# Patient Record
Sex: Female | Born: 1935 | Race: White | Hispanic: No | Marital: Married | Smoking: Former smoker
Health system: Southern US, Community
[De-identification: ages and names within clinical notes are randomized; demographics above are authoritative.]

## PROBLEM LIST (undated history)

## (undated) DIAGNOSIS — E7800 Pure hypercholesterolemia, unspecified: Secondary | ICD-10-CM

## (undated) DIAGNOSIS — I10 Essential (primary) hypertension: Secondary | ICD-10-CM

## (undated) HISTORY — DX: Pure hypercholesterolemia, unspecified: E78.00

## (undated) HISTORY — DX: Essential (primary) hypertension: I10

## (undated) HISTORY — PX: OTHER SURGICAL HISTORY: SHX169

---
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
## 1997-05-13 HISTORY — PX: CATARACT EXTRACTION, BILATERAL: SHX1313

## 1998-05-13 HISTORY — PX: OTHER SURGICAL HISTORY: SHX169

## 1999-04-03 ENCOUNTER — Ambulatory Visit (HOSPITAL_BASED_OUTPATIENT_CLINIC_OR_DEPARTMENT_OTHER): Admission: RE | Admit: 1999-04-03 | Discharge: 1999-04-03 | Payer: Self-pay | Admitting: Orthopedic Surgery

## 1999-05-14 HISTORY — PX: OTHER SURGICAL HISTORY: SHX169

## 1999-05-15 ENCOUNTER — Encounter: Payer: Self-pay | Admitting: Orthopedic Surgery

## 1999-05-15 ENCOUNTER — Encounter: Admission: RE | Admit: 1999-05-15 | Discharge: 1999-05-15 | Payer: Self-pay | Admitting: Orthopedic Surgery

## 1999-06-26 ENCOUNTER — Ambulatory Visit (HOSPITAL_BASED_OUTPATIENT_CLINIC_OR_DEPARTMENT_OTHER): Admission: RE | Admit: 1999-06-26 | Discharge: 1999-06-26 | Payer: Self-pay | Admitting: Orthopedic Surgery

## 2000-04-11 ENCOUNTER — Encounter: Payer: Self-pay | Admitting: Orthopedic Surgery

## 2000-04-12 HISTORY — PX: TOTAL HIP ARTHROPLASTY: SHX124

## 2000-04-14 ENCOUNTER — Encounter: Payer: Self-pay | Admitting: Orthopedic Surgery

## 2000-04-14 ENCOUNTER — Inpatient Hospital Stay (HOSPITAL_COMMUNITY): Admission: RE | Admit: 2000-04-14 | Discharge: 2000-04-18 | Payer: Self-pay | Admitting: Orthopedic Surgery

## 2001-05-13 HISTORY — PX: TOTAL HIP ARTHROPLASTY: SHX124

## 2002-02-11 ENCOUNTER — Encounter: Payer: Self-pay | Admitting: Orthopedic Surgery

## 2002-02-15 ENCOUNTER — Inpatient Hospital Stay (HOSPITAL_COMMUNITY): Admission: RE | Admit: 2002-02-15 | Discharge: 2002-02-19 | Payer: Self-pay | Admitting: Orthopedic Surgery

## 2002-02-16 ENCOUNTER — Encounter: Payer: Self-pay | Admitting: Orthopedic Surgery

## 2002-11-08 ENCOUNTER — Encounter: Admission: RE | Admit: 2002-11-08 | Discharge: 2002-11-08 | Payer: Self-pay | Admitting: Orthopedic Surgery

## 2002-11-08 ENCOUNTER — Encounter: Payer: Self-pay | Admitting: Orthopedic Surgery

## 2004-03-30 ENCOUNTER — Encounter: Payer: Self-pay | Admitting: Urology

## 2004-04-12 ENCOUNTER — Encounter: Payer: Self-pay | Admitting: Urology

## 2004-05-13 ENCOUNTER — Encounter: Payer: Self-pay | Admitting: Urology

## 2004-06-13 ENCOUNTER — Encounter: Payer: Self-pay | Admitting: Urology

## 2004-07-11 ENCOUNTER — Encounter: Payer: Self-pay | Admitting: Urology

## 2004-08-11 ENCOUNTER — Encounter: Payer: Self-pay | Admitting: Urology

## 2004-09-10 ENCOUNTER — Encounter: Payer: Self-pay | Admitting: Urology

## 2004-09-17 ENCOUNTER — Ambulatory Visit: Payer: Self-pay | Admitting: Unknown Physician Specialty

## 2004-12-04 ENCOUNTER — Ambulatory Visit: Payer: Self-pay | Admitting: Family Medicine

## 2006-01-07 ENCOUNTER — Ambulatory Visit: Payer: Self-pay | Admitting: Family Medicine

## 2006-05-13 HISTORY — PX: BASAL CELL CARCINOMA EXCISION: SHX1214

## 2007-01-23 ENCOUNTER — Ambulatory Visit: Payer: Self-pay | Admitting: Unknown Physician Specialty

## 2007-01-26 ENCOUNTER — Ambulatory Visit: Payer: Self-pay | Admitting: Unknown Physician Specialty

## 2007-01-30 ENCOUNTER — Encounter: Payer: Self-pay | Admitting: Unknown Physician Specialty

## 2007-02-06 ENCOUNTER — Ambulatory Visit: Payer: Self-pay | Admitting: Family Medicine

## 2007-02-11 ENCOUNTER — Encounter: Payer: Self-pay | Admitting: Unknown Physician Specialty

## 2007-03-14 ENCOUNTER — Encounter: Payer: Self-pay | Admitting: Unknown Physician Specialty

## 2007-04-13 ENCOUNTER — Encounter: Payer: Self-pay | Admitting: Unknown Physician Specialty

## 2007-05-14 ENCOUNTER — Encounter: Payer: Self-pay | Admitting: Unknown Physician Specialty

## 2007-05-14 HISTORY — PX: CYSTECTOMY: SUR359

## 2007-06-14 ENCOUNTER — Encounter: Payer: Self-pay | Admitting: Unknown Physician Specialty

## 2007-07-12 ENCOUNTER — Encounter: Payer: Self-pay | Admitting: Unknown Physician Specialty

## 2008-02-08 ENCOUNTER — Ambulatory Visit: Payer: Self-pay | Admitting: Family Medicine

## 2008-03-28 ENCOUNTER — Ambulatory Visit: Payer: Self-pay | Admitting: Unknown Physician Specialty

## 2009-02-09 ENCOUNTER — Ambulatory Visit: Payer: Self-pay | Admitting: Family Medicine

## 2009-03-11 ENCOUNTER — Ambulatory Visit: Payer: Self-pay | Admitting: Neurosurgery

## 2009-11-03 ENCOUNTER — Ambulatory Visit: Payer: Self-pay | Admitting: Unknown Physician Specialty

## 2010-02-12 ENCOUNTER — Ambulatory Visit: Payer: Self-pay | Admitting: Family Medicine

## 2010-02-26 ENCOUNTER — Ambulatory Visit: Payer: Self-pay | Admitting: Unknown Physician Specialty

## 2010-03-01 ENCOUNTER — Ambulatory Visit: Payer: Self-pay | Admitting: Unknown Physician Specialty

## 2010-11-26 ENCOUNTER — Emergency Department: Payer: Self-pay | Admitting: Emergency Medicine

## 2011-04-01 ENCOUNTER — Ambulatory Visit: Payer: Self-pay | Admitting: Family Medicine

## 2011-05-30 DIAGNOSIS — H40009 Preglaucoma, unspecified, unspecified eye: Secondary | ICD-10-CM | POA: Diagnosis not present

## 2011-06-06 DIAGNOSIS — J069 Acute upper respiratory infection, unspecified: Secondary | ICD-10-CM | POA: Diagnosis not present

## 2011-06-06 DIAGNOSIS — K449 Diaphragmatic hernia without obstruction or gangrene: Secondary | ICD-10-CM | POA: Diagnosis not present

## 2011-06-06 DIAGNOSIS — R05 Cough: Secondary | ICD-10-CM | POA: Diagnosis not present

## 2011-06-12 DIAGNOSIS — M65839 Other synovitis and tenosynovitis, unspecified forearm: Secondary | ICD-10-CM | POA: Diagnosis not present

## 2011-06-12 DIAGNOSIS — M65849 Other synovitis and tenosynovitis, unspecified hand: Secondary | ICD-10-CM | POA: Diagnosis not present

## 2011-06-12 DIAGNOSIS — M25569 Pain in unspecified knee: Secondary | ICD-10-CM | POA: Diagnosis not present

## 2011-07-01 DIAGNOSIS — R05 Cough: Secondary | ICD-10-CM | POA: Diagnosis not present

## 2011-07-17 DIAGNOSIS — R05 Cough: Secondary | ICD-10-CM | POA: Diagnosis not present

## 2011-07-22 DIAGNOSIS — M1990 Unspecified osteoarthritis, unspecified site: Secondary | ICD-10-CM | POA: Diagnosis not present

## 2011-07-22 DIAGNOSIS — K219 Gastro-esophageal reflux disease without esophagitis: Secondary | ICD-10-CM | POA: Diagnosis not present

## 2011-07-22 DIAGNOSIS — R030 Elevated blood-pressure reading, without diagnosis of hypertension: Secondary | ICD-10-CM | POA: Diagnosis not present

## 2011-07-24 DIAGNOSIS — M1990 Unspecified osteoarthritis, unspecified site: Secondary | ICD-10-CM | POA: Diagnosis not present

## 2011-07-24 DIAGNOSIS — M25569 Pain in unspecified knee: Secondary | ICD-10-CM | POA: Diagnosis not present

## 2011-08-05 DIAGNOSIS — M531 Cervicobrachial syndrome: Secondary | ICD-10-CM | POA: Diagnosis not present

## 2011-09-02 DIAGNOSIS — M79609 Pain in unspecified limb: Secondary | ICD-10-CM | POA: Diagnosis not present

## 2011-09-02 DIAGNOSIS — Z96649 Presence of unspecified artificial hip joint: Secondary | ICD-10-CM | POA: Diagnosis not present

## 2011-09-02 DIAGNOSIS — IMO0002 Reserved for concepts with insufficient information to code with codable children: Secondary | ICD-10-CM | POA: Diagnosis not present

## 2011-09-02 DIAGNOSIS — M25559 Pain in unspecified hip: Secondary | ICD-10-CM | POA: Diagnosis not present

## 2011-09-13 DIAGNOSIS — M47817 Spondylosis without myelopathy or radiculopathy, lumbosacral region: Secondary | ICD-10-CM | POA: Diagnosis not present

## 2011-09-13 DIAGNOSIS — IMO0002 Reserved for concepts with insufficient information to code with codable children: Secondary | ICD-10-CM | POA: Diagnosis not present

## 2011-09-13 DIAGNOSIS — M48062 Spinal stenosis, lumbar region with neurogenic claudication: Secondary | ICD-10-CM | POA: Diagnosis not present

## 2011-09-13 DIAGNOSIS — M5137 Other intervertebral disc degeneration, lumbosacral region: Secondary | ICD-10-CM | POA: Diagnosis not present

## 2011-10-04 DIAGNOSIS — M79609 Pain in unspecified limb: Secondary | ICD-10-CM | POA: Diagnosis not present

## 2011-10-04 DIAGNOSIS — M545 Low back pain: Secondary | ICD-10-CM | POA: Diagnosis not present

## 2011-10-04 DIAGNOSIS — IMO0002 Reserved for concepts with insufficient information to code with codable children: Secondary | ICD-10-CM | POA: Diagnosis not present

## 2012-02-12 DIAGNOSIS — I10 Essential (primary) hypertension: Secondary | ICD-10-CM | POA: Diagnosis not present

## 2012-03-09 DIAGNOSIS — I10 Essential (primary) hypertension: Secondary | ICD-10-CM | POA: Diagnosis not present

## 2012-03-10 DIAGNOSIS — Z23 Encounter for immunization: Secondary | ICD-10-CM | POA: Diagnosis not present

## 2012-04-08 ENCOUNTER — Ambulatory Visit: Payer: Self-pay | Admitting: Family Medicine

## 2012-04-08 DIAGNOSIS — Z1231 Encounter for screening mammogram for malignant neoplasm of breast: Secondary | ICD-10-CM | POA: Diagnosis not present

## 2012-04-16 ENCOUNTER — Ambulatory Visit: Payer: Self-pay | Admitting: Unknown Physician Specialty

## 2012-04-16 DIAGNOSIS — K449 Diaphragmatic hernia without obstruction or gangrene: Secondary | ICD-10-CM | POA: Diagnosis not present

## 2012-04-16 DIAGNOSIS — K219 Gastro-esophageal reflux disease without esophagitis: Secondary | ICD-10-CM | POA: Diagnosis not present

## 2012-04-16 DIAGNOSIS — R059 Cough, unspecified: Secondary | ICD-10-CM | POA: Diagnosis not present

## 2012-04-16 DIAGNOSIS — R05 Cough: Secondary | ICD-10-CM | POA: Diagnosis not present

## 2012-05-01 DIAGNOSIS — R6884 Jaw pain: Secondary | ICD-10-CM | POA: Diagnosis not present

## 2012-05-01 DIAGNOSIS — S0003XA Contusion of scalp, initial encounter: Secondary | ICD-10-CM | POA: Diagnosis not present

## 2012-05-01 DIAGNOSIS — S0083XA Contusion of other part of head, initial encounter: Secondary | ICD-10-CM | POA: Diagnosis not present

## 2012-06-08 DIAGNOSIS — I10 Essential (primary) hypertension: Secondary | ICD-10-CM | POA: Diagnosis not present

## 2012-06-10 DIAGNOSIS — M719 Bursopathy, unspecified: Secondary | ICD-10-CM | POA: Diagnosis not present

## 2012-07-20 DIAGNOSIS — I10 Essential (primary) hypertension: Secondary | ICD-10-CM | POA: Diagnosis not present

## 2012-07-20 DIAGNOSIS — E785 Hyperlipidemia, unspecified: Secondary | ICD-10-CM | POA: Diagnosis not present

## 2012-07-23 DIAGNOSIS — H353190 Nonexudative age-related macular degeneration, unspecified eye, stage unspecified: Secondary | ICD-10-CM | POA: Diagnosis not present

## 2012-08-03 DIAGNOSIS — R51 Headache: Secondary | ICD-10-CM | POA: Diagnosis not present

## 2012-08-04 DIAGNOSIS — E785 Hyperlipidemia, unspecified: Secondary | ICD-10-CM | POA: Diagnosis not present

## 2012-08-04 DIAGNOSIS — Z79899 Other long term (current) drug therapy: Secondary | ICD-10-CM | POA: Diagnosis not present

## 2012-08-05 DIAGNOSIS — D649 Anemia, unspecified: Secondary | ICD-10-CM | POA: Diagnosis not present

## 2012-08-11 DIAGNOSIS — M719 Bursopathy, unspecified: Secondary | ICD-10-CM | POA: Diagnosis not present

## 2012-08-11 DIAGNOSIS — M67919 Unspecified disorder of synovium and tendon, unspecified shoulder: Secondary | ICD-10-CM | POA: Diagnosis not present

## 2012-08-13 DIAGNOSIS — M6281 Muscle weakness (generalized): Secondary | ICD-10-CM | POA: Diagnosis not present

## 2012-08-13 DIAGNOSIS — M25519 Pain in unspecified shoulder: Secondary | ICD-10-CM | POA: Diagnosis not present

## 2012-08-14 DIAGNOSIS — D649 Anemia, unspecified: Secondary | ICD-10-CM | POA: Diagnosis not present

## 2012-08-17 DIAGNOSIS — M6281 Muscle weakness (generalized): Secondary | ICD-10-CM | POA: Diagnosis not present

## 2012-08-17 DIAGNOSIS — M25519 Pain in unspecified shoulder: Secondary | ICD-10-CM | POA: Diagnosis not present

## 2012-08-18 DIAGNOSIS — M25519 Pain in unspecified shoulder: Secondary | ICD-10-CM | POA: Diagnosis not present

## 2012-08-18 DIAGNOSIS — M6281 Muscle weakness (generalized): Secondary | ICD-10-CM | POA: Diagnosis not present

## 2012-08-21 DIAGNOSIS — M25519 Pain in unspecified shoulder: Secondary | ICD-10-CM | POA: Diagnosis not present

## 2012-08-21 DIAGNOSIS — M6281 Muscle weakness (generalized): Secondary | ICD-10-CM | POA: Diagnosis not present

## 2012-08-24 DIAGNOSIS — M25519 Pain in unspecified shoulder: Secondary | ICD-10-CM | POA: Diagnosis not present

## 2012-08-24 DIAGNOSIS — M6281 Muscle weakness (generalized): Secondary | ICD-10-CM | POA: Diagnosis not present

## 2012-08-26 DIAGNOSIS — M25519 Pain in unspecified shoulder: Secondary | ICD-10-CM | POA: Diagnosis not present

## 2012-08-26 DIAGNOSIS — M6281 Muscle weakness (generalized): Secondary | ICD-10-CM | POA: Diagnosis not present

## 2012-08-28 DIAGNOSIS — M6281 Muscle weakness (generalized): Secondary | ICD-10-CM | POA: Diagnosis not present

## 2012-08-28 DIAGNOSIS — M25519 Pain in unspecified shoulder: Secondary | ICD-10-CM | POA: Diagnosis not present

## 2012-08-31 DIAGNOSIS — M6281 Muscle weakness (generalized): Secondary | ICD-10-CM | POA: Diagnosis not present

## 2012-08-31 DIAGNOSIS — M25519 Pain in unspecified shoulder: Secondary | ICD-10-CM | POA: Diagnosis not present

## 2012-09-01 DIAGNOSIS — M6281 Muscle weakness (generalized): Secondary | ICD-10-CM | POA: Diagnosis not present

## 2012-09-01 DIAGNOSIS — M25519 Pain in unspecified shoulder: Secondary | ICD-10-CM | POA: Diagnosis not present

## 2012-09-03 DIAGNOSIS — M25519 Pain in unspecified shoulder: Secondary | ICD-10-CM | POA: Diagnosis not present

## 2012-09-03 DIAGNOSIS — M6281 Muscle weakness (generalized): Secondary | ICD-10-CM | POA: Diagnosis not present

## 2012-09-07 DIAGNOSIS — M6281 Muscle weakness (generalized): Secondary | ICD-10-CM | POA: Diagnosis not present

## 2012-09-07 DIAGNOSIS — D649 Anemia, unspecified: Secondary | ICD-10-CM | POA: Diagnosis not present

## 2012-09-07 DIAGNOSIS — M25519 Pain in unspecified shoulder: Secondary | ICD-10-CM | POA: Diagnosis not present

## 2012-09-08 DIAGNOSIS — M25519 Pain in unspecified shoulder: Secondary | ICD-10-CM | POA: Diagnosis not present

## 2012-09-08 DIAGNOSIS — M6281 Muscle weakness (generalized): Secondary | ICD-10-CM | POA: Diagnosis not present

## 2012-09-09 DIAGNOSIS — M719 Bursopathy, unspecified: Secondary | ICD-10-CM | POA: Diagnosis not present

## 2012-09-09 DIAGNOSIS — M67919 Unspecified disorder of synovium and tendon, unspecified shoulder: Secondary | ICD-10-CM | POA: Diagnosis not present

## 2012-10-29 DIAGNOSIS — D649 Anemia, unspecified: Secondary | ICD-10-CM | POA: Diagnosis not present

## 2012-10-29 DIAGNOSIS — I10 Essential (primary) hypertension: Secondary | ICD-10-CM | POA: Diagnosis not present

## 2012-10-29 DIAGNOSIS — E785 Hyperlipidemia, unspecified: Secondary | ICD-10-CM | POA: Diagnosis not present

## 2012-12-04 DIAGNOSIS — Z79899 Other long term (current) drug therapy: Secondary | ICD-10-CM | POA: Diagnosis not present

## 2012-12-18 DIAGNOSIS — D649 Anemia, unspecified: Secondary | ICD-10-CM | POA: Diagnosis not present

## 2013-01-07 DIAGNOSIS — R609 Edema, unspecified: Secondary | ICD-10-CM | POA: Diagnosis not present

## 2013-01-07 DIAGNOSIS — I10 Essential (primary) hypertension: Secondary | ICD-10-CM | POA: Diagnosis not present

## 2013-01-20 DIAGNOSIS — Z961 Presence of intraocular lens: Secondary | ICD-10-CM | POA: Diagnosis not present

## 2013-01-20 DIAGNOSIS — H2510 Age-related nuclear cataract, unspecified eye: Secondary | ICD-10-CM | POA: Diagnosis not present

## 2013-03-11 ENCOUNTER — Encounter: Payer: Self-pay | Admitting: Podiatry

## 2013-03-11 DIAGNOSIS — Z23 Encounter for immunization: Secondary | ICD-10-CM | POA: Diagnosis not present

## 2013-03-12 ENCOUNTER — Encounter: Payer: Self-pay | Admitting: Podiatry

## 2013-03-12 ENCOUNTER — Ambulatory Visit (INDEPENDENT_AMBULATORY_CARE_PROVIDER_SITE_OTHER): Payer: Medicare Other | Admitting: Podiatry

## 2013-03-12 VITALS — BP 131/81 | HR 82 | Resp 16 | Ht 66.0 in | Wt 160.0 lb

## 2013-03-12 DIAGNOSIS — L600 Ingrowing nail: Secondary | ICD-10-CM

## 2013-03-12 NOTE — Patient Instructions (Signed)

## 2013-03-12 NOTE — Progress Notes (Signed)
Subjective:     Patient ID: Kristin Woods, female   DOB: 1923-07-07, 79 y.o.   MRN: [id number]  HPI patient points to right big toenail stating it is getting thicker painful and I cannot take care of it cut it or wear shoe gear. States she had her other nail remove several years ago and has done very well and she wants the same thing  Review of Systems  All other systems reviewed and are negative.       Objective:   Physical Exam  Nursing note and vitals reviewed. Constitutional: She is oriented to person, place, and time. She appears well-developed.  Cardiovascular: Intact distal pulses.   Musculoskeletal: Normal range of motion.  Neurological: She is oriented to person, place, and time.  Skin: Skin is warm.   patient is found to have damaged thickened big toenail right that is painful when pressed from the dorsal direction. No other pathology noted within the patient's feet and well-healed surgical site left hallux nailbed    Assessment:     Damaged hallux nail right with pain    Plan:     H&P performed and condition discussed. Risks associated with nail removal explained and patient is comfortable with this. Infiltrated 60 mg Xylocaine Marcaine mixture and remove the hallux nail right exposed matrix and applied chemical for applications of phenol 30 seconds followed by sterile dressing. Reappoint to recheck

## 2013-03-12 NOTE — Progress Notes (Signed)
N lifted discolored nail  L right great toenail  D years O gradual   C worse  A  T no treatment

## 2013-04-01 DIAGNOSIS — R112 Nausea with vomiting, unspecified: Secondary | ICD-10-CM | POA: Diagnosis not present

## 2013-04-01 DIAGNOSIS — R109 Unspecified abdominal pain: Secondary | ICD-10-CM | POA: Diagnosis not present

## 2013-04-01 DIAGNOSIS — R1011 Right upper quadrant pain: Secondary | ICD-10-CM | POA: Diagnosis not present

## 2013-04-01 DIAGNOSIS — R1084 Generalized abdominal pain: Secondary | ICD-10-CM | POA: Diagnosis not present

## 2013-04-01 LAB — COMPREHENSIVE METABOLIC PANEL
Alkaline Phosphatase: 77 U/L (ref 50–136)
Anion Gap: 11 (ref 7–16)
Bilirubin,Total: 0.8 mg/dL (ref 0.2–1.0)
Calcium, Total: 10.2 mg/dL — ABNORMAL HIGH (ref 8.5–10.1)
Chloride: 101 mmol/L (ref 98–107)
EGFR (Non-African Amer.): 49 — ABNORMAL LOW
SGOT(AST): 29 U/L (ref 15–37)
SGPT (ALT): 36 U/L (ref 12–78)
Sodium: 136 mmol/L (ref 136–145)

## 2013-04-01 LAB — URINALYSIS, COMPLETE
Blood: NEGATIVE
Glucose,UR: NEGATIVE mg/dL (ref 0–75)
Leukocyte Esterase: NEGATIVE
Ph: 8 (ref 4.5–8.0)
Protein: 30
RBC,UR: 2 /HPF (ref 0–5)
Specific Gravity: 1.006 (ref 1.003–1.030)
Squamous Epithelial: <1

## 2013-04-01 LAB — TROPONIN I: Troponin-I: <0.02 ng/mL

## 2013-04-01 LAB — CBC WITH DIFFERENTIAL/PLATELET
Basophil #: 0.1 10*3/uL (ref 0.0–0.1)
Basophil %: 0.9 %
Eosinophil #: 0 10*3/uL (ref 0.0–0.7)
Eosinophil %: 0.4 %
HGB: 14.3 g/dL (ref 12.0–16.0)
Lymphocyte #: 1.7 10*3/uL (ref 1.0–3.6)
Lymphocyte %: 14 %
MCH: 30.8 pg (ref 26.0–34.0)
MCHC: 34.2 g/dL (ref 32.0–36.0)
MCV: 90 fL (ref 80–100)
Monocyte #: 0.8 x10 3/mm (ref 0.2–0.9)
Monocyte %: 6.1 %
Neutrophil #: 9.6 10*3/uL — ABNORMAL HIGH (ref 1.4–6.5)
Platelet: 303 10*3/uL (ref 150–440)

## 2013-04-02 ENCOUNTER — Inpatient Hospital Stay: Payer: Self-pay | Admitting: Surgery

## 2013-04-02 DIAGNOSIS — IMO0002 Reserved for concepts with insufficient information to code with codable children: Secondary | ICD-10-CM | POA: Diagnosis not present

## 2013-04-02 DIAGNOSIS — R112 Nausea with vomiting, unspecified: Secondary | ICD-10-CM | POA: Diagnosis not present

## 2013-04-02 DIAGNOSIS — R32 Unspecified urinary incontinence: Secondary | ICD-10-CM | POA: Diagnosis not present

## 2013-04-02 DIAGNOSIS — R1084 Generalized abdominal pain: Secondary | ICD-10-CM | POA: Diagnosis not present

## 2013-04-02 DIAGNOSIS — R9431 Abnormal electrocardiogram [ECG] [EKG]: Secondary | ICD-10-CM | POA: Diagnosis not present

## 2013-04-02 DIAGNOSIS — Z96649 Presence of unspecified artificial hip joint: Secondary | ICD-10-CM | POA: Diagnosis not present

## 2013-04-02 DIAGNOSIS — R1011 Right upper quadrant pain: Secondary | ICD-10-CM | POA: Diagnosis not present

## 2013-04-02 DIAGNOSIS — K819 Cholecystitis, unspecified: Secondary | ICD-10-CM | POA: Diagnosis not present

## 2013-04-02 DIAGNOSIS — K810 Acute cholecystitis: Secondary | ICD-10-CM | POA: Diagnosis not present

## 2013-04-02 DIAGNOSIS — R1013 Epigastric pain: Secondary | ICD-10-CM | POA: Diagnosis not present

## 2013-04-02 DIAGNOSIS — D649 Anemia, unspecified: Secondary | ICD-10-CM | POA: Diagnosis not present

## 2013-04-02 DIAGNOSIS — K812 Acute cholecystitis with chronic cholecystitis: Secondary | ICD-10-CM | POA: Diagnosis not present

## 2013-04-02 DIAGNOSIS — R109 Unspecified abdominal pain: Secondary | ICD-10-CM | POA: Diagnosis not present

## 2013-04-02 LAB — CBC WITH DIFFERENTIAL/PLATELET
Basophil #: 0.1 10*3/uL (ref 0.0–0.1)
Eosinophil %: 1.1 %
Lymphocyte #: 2.1 10*3/uL (ref 1.0–3.6)
Lymphocyte %: 15.6 %
MCH: 30 pg (ref 26.0–34.0)
MCHC: 33.9 g/dL (ref 32.0–36.0)
MCV: 89 fL (ref 80–100)
Monocyte #: 1.2 x10 3/mm — ABNORMAL HIGH (ref 0.2–0.9)
Neutrophil %: 73.5 %
RDW: 13.5 % (ref 11.5–14.5)
WBC: 13.3 10*3/uL — ABNORMAL HIGH (ref 3.6–11.0)

## 2013-04-02 LAB — COMPREHENSIVE METABOLIC PANEL
Albumin: 3.7 g/dL (ref 3.4–5.0)
Alkaline Phosphatase: 73 U/L (ref 50–136)
Anion Gap: 8 (ref 7–16)
BUN: 11 mg/dL (ref 7–18)
Bilirubin,Total: 0.8 mg/dL (ref 0.2–1.0)
Creatinine: 1.07 mg/dL (ref 0.60–1.30)
EGFR (African American): 54 — ABNORMAL LOW
EGFR (Non-African Amer.): 47 — ABNORMAL LOW
Potassium: 3.4 mmol/L — ABNORMAL LOW (ref 3.5–5.1)
SGOT(AST): 39 U/L — ABNORMAL HIGH (ref 15–37)
SGPT (ALT): 34 U/L (ref 12–78)
Sodium: 134 mmol/L — ABNORMAL LOW (ref 136–145)
Total Protein: 7.3 g/dL (ref 6.4–8.2)

## 2013-04-02 LAB — LIPASE, BLOOD: Lipase: 381 U/L (ref 73–393)

## 2013-04-04 LAB — HEPATIC FUNCTION PANEL A (ARMC)
Alkaline Phosphatase: 59 U/L
Bilirubin, Direct: 0.1 mg/dL (ref 0.00–0.20)
SGOT(AST): 72 U/L — ABNORMAL HIGH (ref 15–37)
SGPT (ALT): 51 U/L (ref 12–78)
Total Protein: 6.9 g/dL (ref 6.4–8.2)

## 2013-04-04 LAB — CBC WITH DIFFERENTIAL/PLATELET
Basophil #: 0 10*3/uL (ref 0.0–0.1)
Basophil %: 0.2 %
Lymphocyte %: 7.3 %
Monocyte #: 0.7 x10 3/mm (ref 0.2–0.9)
Neutrophil #: 13 10*3/uL — ABNORMAL HIGH (ref 1.4–6.5)
Platelet: 259 10*3/uL (ref 150–440)
RBC: 4.2 10*6/uL (ref 3.80–5.20)
RDW: 13.8 % (ref 11.5–14.5)
WBC: 14.8 10*3/uL — ABNORMAL HIGH (ref 3.6–11.0)

## 2013-04-04 LAB — BASIC METABOLIC PANEL
Anion Gap: 7 (ref 7–16)
BUN: 14 mg/dL (ref 7–18)
Calcium, Total: 8.6 mg/dL (ref 8.5–10.1)
Co2: 24 mmol/L (ref 21–32)
EGFR (Non-African Amer.): 48 — ABNORMAL LOW
Glucose: 127 mg/dL — ABNORMAL HIGH (ref 65–99)
Potassium: 4.5 mmol/L (ref 3.5–5.1)
Sodium: 135 mmol/L — ABNORMAL LOW (ref 136–145)

## 2013-04-06 LAB — PATHOLOGY REPORT

## 2013-04-12 DIAGNOSIS — I10 Essential (primary) hypertension: Secondary | ICD-10-CM | POA: Diagnosis not present

## 2013-04-13 ENCOUNTER — Ambulatory Visit: Payer: Self-pay | Admitting: Family Medicine

## 2013-04-13 DIAGNOSIS — Z1231 Encounter for screening mammogram for malignant neoplasm of breast: Secondary | ICD-10-CM | POA: Diagnosis not present

## 2013-06-21 DIAGNOSIS — D649 Anemia, unspecified: Secondary | ICD-10-CM | POA: Diagnosis not present

## 2013-06-21 DIAGNOSIS — R51 Headache: Secondary | ICD-10-CM | POA: Diagnosis not present

## 2013-06-21 DIAGNOSIS — E785 Hyperlipidemia, unspecified: Secondary | ICD-10-CM | POA: Diagnosis not present

## 2013-06-28 DIAGNOSIS — M531 Cervicobrachial syndrome: Secondary | ICD-10-CM | POA: Diagnosis not present

## 2013-06-28 DIAGNOSIS — R413 Other amnesia: Secondary | ICD-10-CM | POA: Diagnosis not present

## 2013-07-21 DIAGNOSIS — F411 Generalized anxiety disorder: Secondary | ICD-10-CM | POA: Diagnosis not present

## 2013-07-21 DIAGNOSIS — R0602 Shortness of breath: Secondary | ICD-10-CM | POA: Diagnosis not present

## 2013-07-21 DIAGNOSIS — R300 Dysuria: Secondary | ICD-10-CM | POA: Diagnosis not present

## 2013-07-21 DIAGNOSIS — N390 Urinary tract infection, site not specified: Secondary | ICD-10-CM | POA: Diagnosis not present

## 2013-08-11 DIAGNOSIS — F4320 Adjustment disorder, unspecified: Secondary | ICD-10-CM | POA: Diagnosis not present

## 2013-08-11 DIAGNOSIS — L0390 Cellulitis, unspecified: Secondary | ICD-10-CM | POA: Diagnosis not present

## 2013-08-11 DIAGNOSIS — L0291 Cutaneous abscess, unspecified: Secondary | ICD-10-CM | POA: Diagnosis not present

## 2013-08-12 DIAGNOSIS — L0390 Cellulitis, unspecified: Secondary | ICD-10-CM | POA: Diagnosis not present

## 2013-08-12 DIAGNOSIS — L0291 Cutaneous abscess, unspecified: Secondary | ICD-10-CM | POA: Diagnosis not present

## 2013-08-18 DIAGNOSIS — L0291 Cutaneous abscess, unspecified: Secondary | ICD-10-CM | POA: Diagnosis not present

## 2013-08-18 DIAGNOSIS — F4320 Adjustment disorder, unspecified: Secondary | ICD-10-CM | POA: Diagnosis not present

## 2013-08-25 DIAGNOSIS — E785 Hyperlipidemia, unspecified: Secondary | ICD-10-CM | POA: Diagnosis not present

## 2013-08-25 DIAGNOSIS — R51 Headache: Secondary | ICD-10-CM | POA: Diagnosis not present

## 2013-08-25 DIAGNOSIS — D649 Anemia, unspecified: Secondary | ICD-10-CM | POA: Diagnosis not present

## 2013-08-25 DIAGNOSIS — Z79899 Other long term (current) drug therapy: Secondary | ICD-10-CM | POA: Diagnosis not present

## 2013-09-08 DIAGNOSIS — N390 Urinary tract infection, site not specified: Secondary | ICD-10-CM | POA: Diagnosis not present

## 2013-09-08 DIAGNOSIS — F4320 Adjustment disorder, unspecified: Secondary | ICD-10-CM | POA: Diagnosis not present

## 2013-09-29 DIAGNOSIS — H40009 Preglaucoma, unspecified, unspecified eye: Secondary | ICD-10-CM | POA: Diagnosis not present

## 2013-10-20 DIAGNOSIS — Z79899 Other long term (current) drug therapy: Secondary | ICD-10-CM | POA: Diagnosis not present

## 2013-10-21 DIAGNOSIS — E538 Deficiency of other specified B group vitamins: Secondary | ICD-10-CM | POA: Diagnosis not present

## 2013-10-21 DIAGNOSIS — Z79899 Other long term (current) drug therapy: Secondary | ICD-10-CM | POA: Diagnosis not present

## 2013-10-22 DIAGNOSIS — R300 Dysuria: Secondary | ICD-10-CM | POA: Diagnosis not present

## 2013-11-23 DIAGNOSIS — F329 Major depressive disorder, single episode, unspecified: Secondary | ICD-10-CM | POA: Diagnosis not present

## 2013-11-23 DIAGNOSIS — I10 Essential (primary) hypertension: Secondary | ICD-10-CM | POA: Diagnosis not present

## 2013-12-06 DIAGNOSIS — N390 Urinary tract infection, site not specified: Secondary | ICD-10-CM | POA: Diagnosis not present

## 2013-12-23 DIAGNOSIS — M6281 Muscle weakness (generalized): Secondary | ICD-10-CM | POA: Diagnosis not present

## 2013-12-23 DIAGNOSIS — R269 Unspecified abnormalities of gait and mobility: Secondary | ICD-10-CM | POA: Diagnosis not present

## 2013-12-24 DIAGNOSIS — R269 Unspecified abnormalities of gait and mobility: Secondary | ICD-10-CM | POA: Diagnosis not present

## 2013-12-24 DIAGNOSIS — M6281 Muscle weakness (generalized): Secondary | ICD-10-CM | POA: Diagnosis not present

## 2013-12-27 DIAGNOSIS — M6281 Muscle weakness (generalized): Secondary | ICD-10-CM | POA: Diagnosis not present

## 2013-12-27 DIAGNOSIS — R269 Unspecified abnormalities of gait and mobility: Secondary | ICD-10-CM | POA: Diagnosis not present

## 2013-12-29 DIAGNOSIS — M6281 Muscle weakness (generalized): Secondary | ICD-10-CM | POA: Diagnosis not present

## 2013-12-29 DIAGNOSIS — R269 Unspecified abnormalities of gait and mobility: Secondary | ICD-10-CM | POA: Diagnosis not present

## 2013-12-30 DIAGNOSIS — M6281 Muscle weakness (generalized): Secondary | ICD-10-CM | POA: Diagnosis not present

## 2013-12-30 DIAGNOSIS — R269 Unspecified abnormalities of gait and mobility: Secondary | ICD-10-CM | POA: Diagnosis not present

## 2013-12-31 DIAGNOSIS — R269 Unspecified abnormalities of gait and mobility: Secondary | ICD-10-CM | POA: Diagnosis not present

## 2013-12-31 DIAGNOSIS — M6281 Muscle weakness (generalized): Secondary | ICD-10-CM | POA: Diagnosis not present

## 2014-01-03 ENCOUNTER — Emergency Department: Payer: Self-pay | Admitting: Emergency Medicine

## 2014-01-03 DIAGNOSIS — R269 Unspecified abnormalities of gait and mobility: Secondary | ICD-10-CM | POA: Diagnosis not present

## 2014-01-03 DIAGNOSIS — F3289 Other specified depressive episodes: Secondary | ICD-10-CM | POA: Diagnosis not present

## 2014-01-03 DIAGNOSIS — Z139 Encounter for screening, unspecified: Secondary | ICD-10-CM | POA: Diagnosis not present

## 2014-01-03 DIAGNOSIS — I517 Cardiomegaly: Secondary | ICD-10-CM | POA: Diagnosis not present

## 2014-01-03 DIAGNOSIS — F329 Major depressive disorder, single episode, unspecified: Secondary | ICD-10-CM | POA: Diagnosis not present

## 2014-01-03 DIAGNOSIS — Z0389 Encounter for observation for other suspected diseases and conditions ruled out: Secondary | ICD-10-CM | POA: Diagnosis not present

## 2014-01-03 DIAGNOSIS — R45851 Suicidal ideations: Secondary | ICD-10-CM | POA: Diagnosis not present

## 2014-01-03 DIAGNOSIS — M6281 Muscle weakness (generalized): Secondary | ICD-10-CM | POA: Diagnosis not present

## 2014-01-03 LAB — COMPREHENSIVE METABOLIC PANEL
Albumin: 3.9 g/dL (ref 3.4–5.0)
Alkaline Phosphatase: 74 U/L
Anion Gap: 8 (ref 7–16)
BILIRUBIN TOTAL: 0.6 mg/dL (ref 0.2–1.0)
BUN: 18 mg/dL (ref 7–18)
CO2: 28 mmol/L (ref 21–32)
Calcium, Total: 9.2 mg/dL (ref 8.5–10.1)
Chloride: 103 mmol/L (ref 98–107)
Creatinine: 1.13 mg/dL (ref 0.60–1.30)
EGFR (Non-African Amer.): 43 — ABNORMAL LOW
GFR CALC AF AMER: 50 — AB
GLUCOSE: 121 mg/dL — AB (ref 65–99)
Osmolality: 281 (ref 275–301)
Potassium: 3.9 mmol/L (ref 3.5–5.1)
SGOT(AST): 23 U/L (ref 15–37)
SGPT (ALT): 20 U/L
Sodium: 139 mmol/L (ref 136–145)
Total Protein: 7.4 g/dL (ref 6.4–8.2)

## 2014-01-03 LAB — URINALYSIS, COMPLETE
Bilirubin,UR: NEGATIVE
Blood: NEGATIVE
GLUCOSE, UR: NEGATIVE mg/dL (ref 0–75)
Ketone: NEGATIVE
NITRITE: NEGATIVE
Ph: 7 (ref 4.5–8.0)
Protein: NEGATIVE
RBC,UR: 3 /HPF (ref 0–5)
Specific Gravity: 1.01 (ref 1.003–1.030)
WBC UR: 96 /HPF (ref 0–5)

## 2014-01-03 LAB — DRUG SCREEN, URINE

## 2014-01-03 LAB — ACETAMINOPHEN LEVEL: Acetaminophen: <2 ug/mL

## 2014-01-03 LAB — CBC
HCT: 39.2 % (ref 35.0–47.0)
HGB: 12.9 g/dL (ref 12.0–16.0)
MCH: 30.2 pg (ref 26.0–34.0)
MCHC: 33 g/dL (ref 32.0–36.0)
MCV: 92 fL (ref 80–100)
Platelet: 279 10*3/uL (ref 150–440)
RBC: 4.28 10*6/uL (ref 3.80–5.20)
RDW: 14.1 % (ref 11.5–14.5)
WBC: 7.5 10*3/uL (ref 3.6–11.0)

## 2014-01-03 LAB — ETHANOL: Ethanol %: <0.003 % (ref 0.000–0.080)

## 2014-01-03 LAB — TSH: THYROID STIMULATING HORM: 2.09 u[IU]/mL

## 2014-01-03 LAB — SALICYLATE LEVEL: Salicylates, Serum: <1.7 mg/dL

## 2014-01-04 DIAGNOSIS — F03918 Unspecified dementia, unspecified severity, with other behavioral disturbance: Secondary | ICD-10-CM | POA: Diagnosis present

## 2014-01-04 DIAGNOSIS — Z634 Disappearance and death of family member: Secondary | ICD-10-CM | POA: Diagnosis not present

## 2014-01-04 DIAGNOSIS — F09 Unspecified mental disorder due to known physiological condition: Secondary | ICD-10-CM | POA: Diagnosis not present

## 2014-01-04 DIAGNOSIS — I10 Essential (primary) hypertension: Secondary | ICD-10-CM | POA: Diagnosis present

## 2014-01-04 DIAGNOSIS — R45851 Suicidal ideations: Secondary | ICD-10-CM | POA: Diagnosis not present

## 2014-01-04 DIAGNOSIS — E7800 Pure hypercholesterolemia, unspecified: Secondary | ICD-10-CM | POA: Diagnosis present

## 2014-01-04 DIAGNOSIS — F322 Major depressive disorder, single episode, severe without psychotic features: Secondary | ICD-10-CM | POA: Diagnosis not present

## 2014-01-04 DIAGNOSIS — F0391 Unspecified dementia with behavioral disturbance: Secondary | ICD-10-CM | POA: Diagnosis present

## 2014-01-04 DIAGNOSIS — Z87891 Personal history of nicotine dependence: Secondary | ICD-10-CM | POA: Diagnosis not present

## 2014-01-12 DIAGNOSIS — R269 Unspecified abnormalities of gait and mobility: Secondary | ICD-10-CM | POA: Diagnosis not present

## 2014-01-12 DIAGNOSIS — M6281 Muscle weakness (generalized): Secondary | ICD-10-CM | POA: Diagnosis not present

## 2014-01-13 DIAGNOSIS — N390 Urinary tract infection, site not specified: Secondary | ICD-10-CM | POA: Diagnosis not present

## 2014-01-13 DIAGNOSIS — R031 Nonspecific low blood-pressure reading: Secondary | ICD-10-CM | POA: Diagnosis not present

## 2014-01-13 DIAGNOSIS — F4489 Other dissociative and conversion disorders: Secondary | ICD-10-CM | POA: Diagnosis not present

## 2014-01-13 DIAGNOSIS — R4182 Altered mental status, unspecified: Secondary | ICD-10-CM | POA: Diagnosis not present

## 2014-01-13 DIAGNOSIS — R42 Dizziness and giddiness: Secondary | ICD-10-CM | POA: Diagnosis not present

## 2014-01-13 DIAGNOSIS — R5381 Other malaise: Secondary | ICD-10-CM | POA: Diagnosis not present

## 2014-01-13 DIAGNOSIS — E785 Hyperlipidemia, unspecified: Secondary | ICD-10-CM | POA: Diagnosis not present

## 2014-01-13 DIAGNOSIS — R5383 Other fatigue: Secondary | ICD-10-CM | POA: Diagnosis not present

## 2014-01-13 DIAGNOSIS — E875 Hyperkalemia: Secondary | ICD-10-CM | POA: Diagnosis not present

## 2014-01-13 LAB — BASIC METABOLIC PANEL
Anion Gap: 11 (ref 7–16)
BUN: 18 mg/dL (ref 7–18)
CHLORIDE: 101 mmol/L (ref 98–107)
Calcium, Total: 9.7 mg/dL (ref 8.5–10.1)
Co2: 22 mmol/L (ref 21–32)
Creatinine: 0.89 mg/dL (ref 0.60–1.30)
EGFR (African American): >60
EGFR (Non-African Amer.): 58 — ABNORMAL LOW
Glucose: 94 mg/dL (ref 65–99)
Osmolality: 270 (ref 275–301)
POTASSIUM: 5.6 mmol/L — AB (ref 3.5–5.1)
Sodium: 134 mmol/L — ABNORMAL LOW (ref 136–145)

## 2014-01-13 LAB — CBC WITH DIFFERENTIAL/PLATELET
BASOS PCT: 0.6 %
Basophil #: 0.1 10*3/uL (ref 0.0–0.1)
Eosinophil #: 0.1 10*3/uL (ref 0.0–0.7)
Eosinophil %: 1.3 %
HCT: 40.7 % (ref 35.0–47.0)
HGB: 13.4 g/dL (ref 12.0–16.0)
Lymphocyte #: 3.3 10*3/uL (ref 1.0–3.6)
Lymphocyte %: 32 %
MCH: 29.8 pg (ref 26.0–34.0)
MCHC: 32.9 g/dL (ref 32.0–36.0)
MCV: 91 fL (ref 80–100)
MONOS PCT: 8.2 %
Monocyte #: 0.8 x10 3/mm (ref 0.2–0.9)
Neutrophil #: 5.9 10*3/uL (ref 1.4–6.5)
Neutrophil %: 57.9 %
Platelet: 318 10*3/uL (ref 150–440)
RBC: 4.48 10*6/uL (ref 3.80–5.20)
RDW: 14.1 % (ref 11.5–14.5)
WBC: 10.2 10*3/uL (ref 3.6–11.0)

## 2014-01-13 LAB — URINALYSIS, COMPLETE
Bilirubin,UR: NEGATIVE
Blood: NEGATIVE
GLUCOSE, UR: NEGATIVE mg/dL (ref 0–75)
Nitrite: NEGATIVE
PROTEIN: NEGATIVE
Ph: 7 (ref 4.5–8.0)
RBC,UR: 2 /HPF (ref 0–5)
Specific Gravity: 1.009 (ref 1.003–1.030)
Squamous Epithelial: 4
WBC UR: 28 /HPF (ref 0–5)

## 2014-01-13 LAB — TROPONIN I

## 2014-01-14 ENCOUNTER — Inpatient Hospital Stay: Payer: Self-pay | Admitting: Internal Medicine

## 2014-01-14 DIAGNOSIS — Z9089 Acquired absence of other organs: Secondary | ICD-10-CM | POA: Diagnosis not present

## 2014-01-14 DIAGNOSIS — I951 Orthostatic hypotension: Secondary | ICD-10-CM | POA: Diagnosis present

## 2014-01-14 DIAGNOSIS — R42 Dizziness and giddiness: Secondary | ICD-10-CM | POA: Diagnosis present

## 2014-01-14 DIAGNOSIS — Z96649 Presence of unspecified artificial hip joint: Secondary | ICD-10-CM | POA: Diagnosis not present

## 2014-01-14 DIAGNOSIS — R32 Unspecified urinary incontinence: Secondary | ICD-10-CM | POA: Diagnosis present

## 2014-01-14 DIAGNOSIS — D649 Anemia, unspecified: Secondary | ICD-10-CM | POA: Diagnosis present

## 2014-01-14 DIAGNOSIS — Z9849 Cataract extraction status, unspecified eye: Secondary | ICD-10-CM | POA: Diagnosis not present

## 2014-01-14 DIAGNOSIS — E875 Hyperkalemia: Secondary | ICD-10-CM | POA: Diagnosis not present

## 2014-01-14 DIAGNOSIS — F329 Major depressive disorder, single episode, unspecified: Secondary | ICD-10-CM | POA: Diagnosis present

## 2014-01-14 DIAGNOSIS — Z87440 Personal history of urinary (tract) infections: Secondary | ICD-10-CM | POA: Diagnosis not present

## 2014-01-14 DIAGNOSIS — F0390 Unspecified dementia without behavioral disturbance: Secondary | ICD-10-CM | POA: Diagnosis present

## 2014-01-14 DIAGNOSIS — R031 Nonspecific low blood-pressure reading: Secondary | ICD-10-CM | POA: Diagnosis not present

## 2014-01-14 DIAGNOSIS — R45851 Suicidal ideations: Secondary | ICD-10-CM | POA: Diagnosis not present

## 2014-01-14 DIAGNOSIS — N390 Urinary tract infection, site not specified: Secondary | ICD-10-CM | POA: Diagnosis not present

## 2014-01-14 DIAGNOSIS — E785 Hyperlipidemia, unspecified: Secondary | ICD-10-CM | POA: Diagnosis not present

## 2014-01-14 DIAGNOSIS — F3289 Other specified depressive episodes: Secondary | ICD-10-CM | POA: Diagnosis present

## 2014-01-14 DIAGNOSIS — R4182 Altered mental status, unspecified: Secondary | ICD-10-CM | POA: Diagnosis not present

## 2014-01-14 DIAGNOSIS — F411 Generalized anxiety disorder: Secondary | ICD-10-CM | POA: Diagnosis present

## 2014-01-14 DIAGNOSIS — R5381 Other malaise: Secondary | ICD-10-CM | POA: Diagnosis present

## 2014-01-14 DIAGNOSIS — E876 Hypokalemia: Secondary | ICD-10-CM | POA: Diagnosis not present

## 2014-01-14 DIAGNOSIS — R5383 Other fatigue: Secondary | ICD-10-CM | POA: Diagnosis not present

## 2014-01-14 DIAGNOSIS — M1990 Unspecified osteoarthritis, unspecified site: Secondary | ICD-10-CM | POA: Diagnosis present

## 2014-01-14 LAB — CBC WITH DIFFERENTIAL/PLATELET
BASOS ABS: 0 10*3/uL (ref 0.0–0.1)
Basophil %: 0.5 %
Eosinophil #: 0.2 10*3/uL (ref 0.0–0.7)
Eosinophil %: 2.1 %
HCT: 39.9 % (ref 35.0–47.0)
HGB: 13 g/dL (ref 12.0–16.0)
LYMPHS ABS: 2.1 10*3/uL (ref 1.0–3.6)
LYMPHS PCT: 22.6 %
MCH: 29.8 pg (ref 26.0–34.0)
MCHC: 32.6 g/dL (ref 32.0–36.0)
MCV: 91 fL (ref 80–100)
Monocyte #: 0.9 x10 3/mm (ref 0.2–0.9)
Monocyte %: 10.3 %
NEUTROS ABS: 5.9 10*3/uL (ref 1.4–6.5)
Neutrophil %: 64.5 %
Platelet: 273 10*3/uL (ref 150–440)
RBC: 4.37 10*6/uL (ref 3.80–5.20)
RDW: 13.8 % (ref 11.5–14.5)
WBC: 9.2 10*3/uL (ref 3.6–11.0)

## 2014-01-14 LAB — BASIC METABOLIC PANEL
Anion Gap: 11 (ref 7–16)
BUN: 12 mg/dL (ref 7–18)
CALCIUM: 8.6 mg/dL (ref 8.5–10.1)
Chloride: 105 mmol/L (ref 98–107)
Co2: 25 mmol/L (ref 21–32)
Creatinine: 0.87 mg/dL (ref 0.60–1.30)
EGFR (African American): >60
GFR CALC NON AF AMER: 59 — AB
Glucose: 94 mg/dL (ref 65–99)
OSMOLALITY: 281 (ref 275–301)
Potassium: 3.1 mmol/L — ABNORMAL LOW (ref 3.5–5.1)
Sodium: 141 mmol/L (ref 136–145)

## 2014-01-14 LAB — TSH: THYROID STIMULATING HORM: 1.78 u[IU]/mL

## 2014-01-15 LAB — BASIC METABOLIC PANEL
ANION GAP: 10 (ref 7–16)
BUN: 10 mg/dL (ref 7–18)
CREATININE: 0.81 mg/dL (ref 0.60–1.30)
Calcium, Total: 8.6 mg/dL (ref 8.5–10.1)
Chloride: 105 mmol/L (ref 98–107)
Co2: 25 mmol/L (ref 21–32)
EGFR (Non-African Amer.): >60
GLUCOSE: 97 mg/dL (ref 65–99)
OSMOLALITY: 278 (ref 275–301)
POTASSIUM: 3.2 mmol/L — AB (ref 3.5–5.1)
SODIUM: 140 mmol/L (ref 136–145)

## 2014-01-15 LAB — MAGNESIUM: Magnesium: 1.6 mg/dL — ABNORMAL LOW

## 2014-01-15 LAB — URINE CULTURE

## 2014-01-18 LAB — CULTURE, BLOOD (SINGLE)

## 2014-01-19 DIAGNOSIS — E785 Hyperlipidemia, unspecified: Secondary | ICD-10-CM

## 2014-01-19 DIAGNOSIS — M159 Polyosteoarthritis, unspecified: Secondary | ICD-10-CM | POA: Diagnosis not present

## 2014-01-19 DIAGNOSIS — F068 Other specified mental disorders due to known physiological condition: Secondary | ICD-10-CM

## 2014-01-20 DIAGNOSIS — E785 Hyperlipidemia, unspecified: Secondary | ICD-10-CM | POA: Diagnosis not present

## 2014-01-20 DIAGNOSIS — I10 Essential (primary) hypertension: Secondary | ICD-10-CM | POA: Diagnosis not present

## 2014-01-20 DIAGNOSIS — D510 Vitamin B12 deficiency anemia due to intrinsic factor deficiency: Secondary | ICD-10-CM | POA: Diagnosis not present

## 2014-01-31 DIAGNOSIS — F0280 Dementia in other diseases classified elsewhere without behavioral disturbance: Secondary | ICD-10-CM

## 2014-01-31 DIAGNOSIS — G309 Alzheimer's disease, unspecified: Secondary | ICD-10-CM

## 2014-02-17 DIAGNOSIS — G301 Alzheimer's disease with late onset: Secondary | ICD-10-CM | POA: Diagnosis not present

## 2014-02-17 DIAGNOSIS — F4321 Adjustment disorder with depressed mood: Secondary | ICD-10-CM | POA: Diagnosis not present

## 2014-02-21 DIAGNOSIS — F4323 Adjustment disorder with mixed anxiety and depressed mood: Secondary | ICD-10-CM | POA: Diagnosis not present

## 2014-03-23 IMAGING — MG MM CAD SCREENING MAMMO
1 series · 4 of 4 positions shown · non-contrast
Comparison: none

REASON FOR EXAM: SCR MAMMO NO ORDER
COMMENTS:

PROCEDURE:     MAM - MAM DGTL SCRN MAM NO ORDER W/CAD  - April 08, 2012 [DATE]
RESULT:     COMPARISON:  04/01/2011, 02/12/2010, 02/09/2009, 02/06/2007
TECHNIQUE: Digital screening mammograms were obtained. FDA approved
computer-aided detection (CAD) for mammography was utilized for this study.

[R CC · right · 4 of 4 slices shown]
[im 1/4]
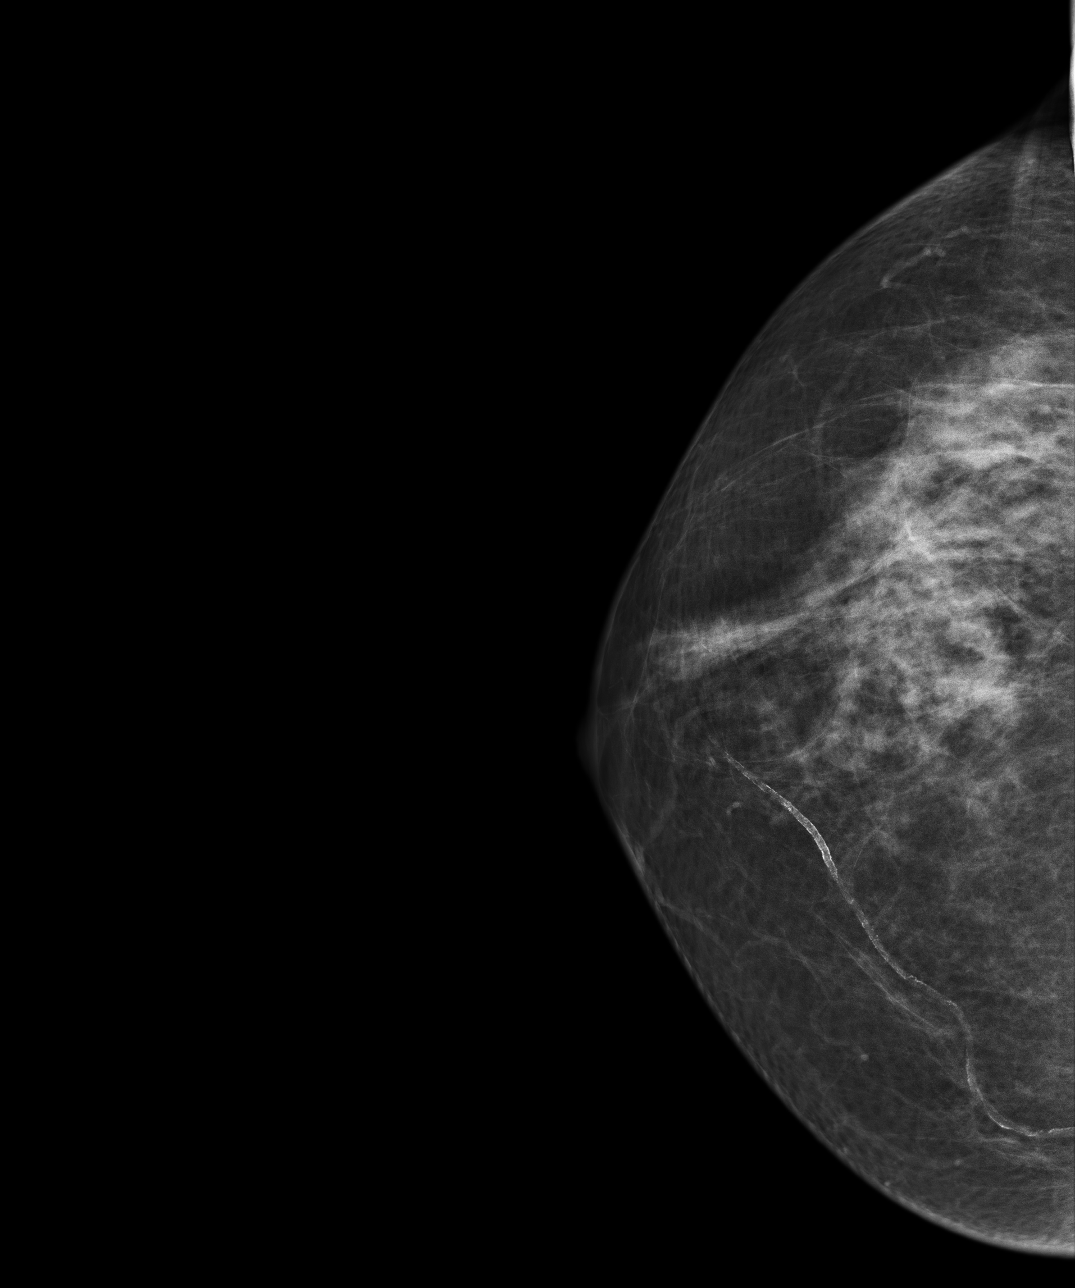
[im 2/4]
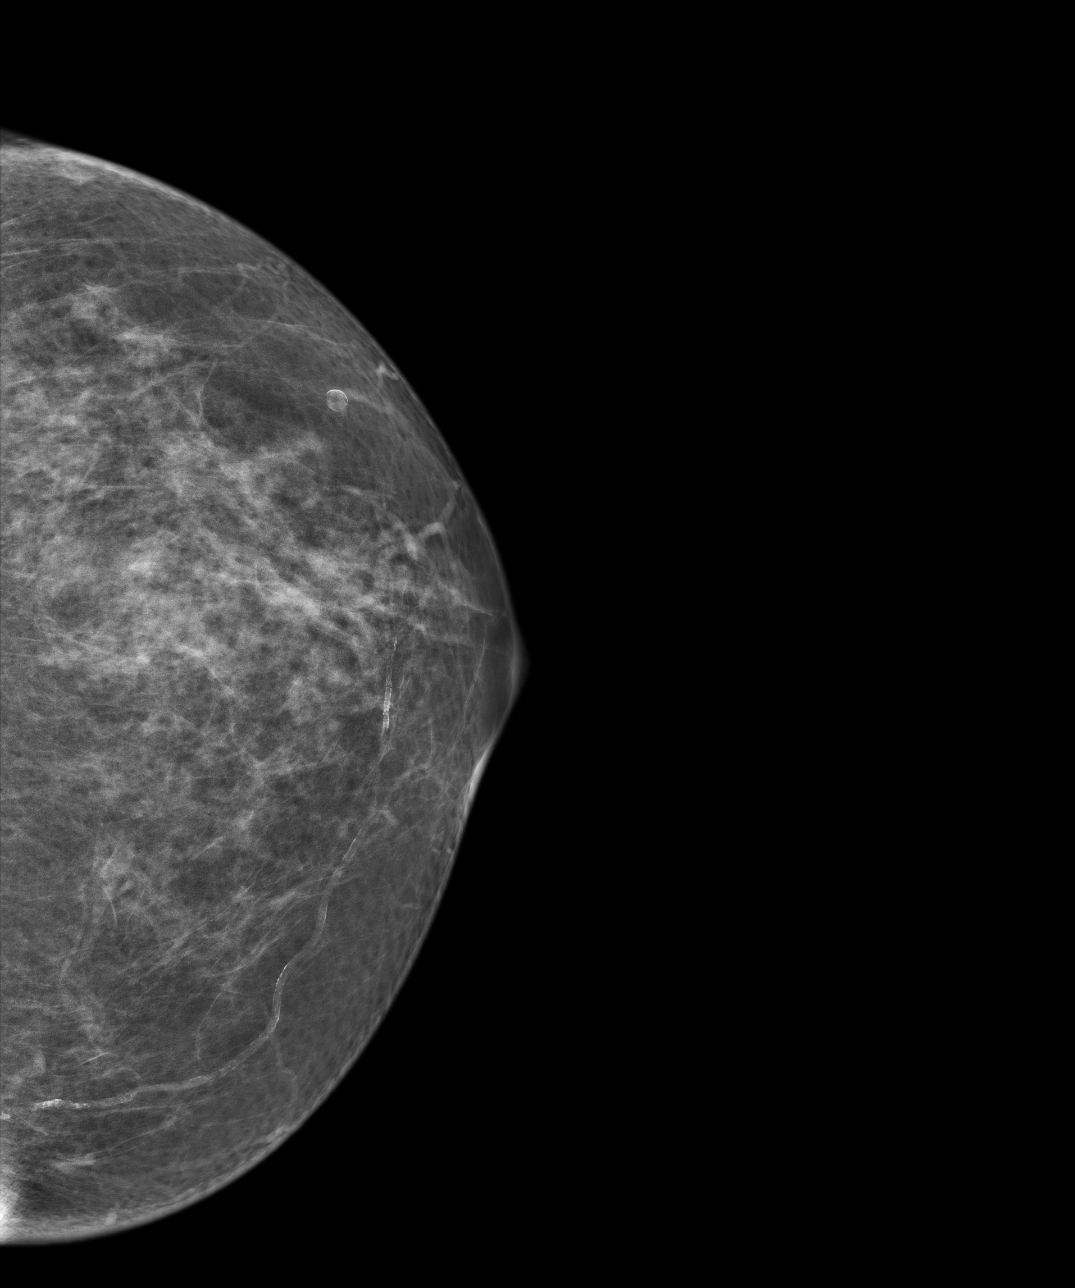
[im 3/4]
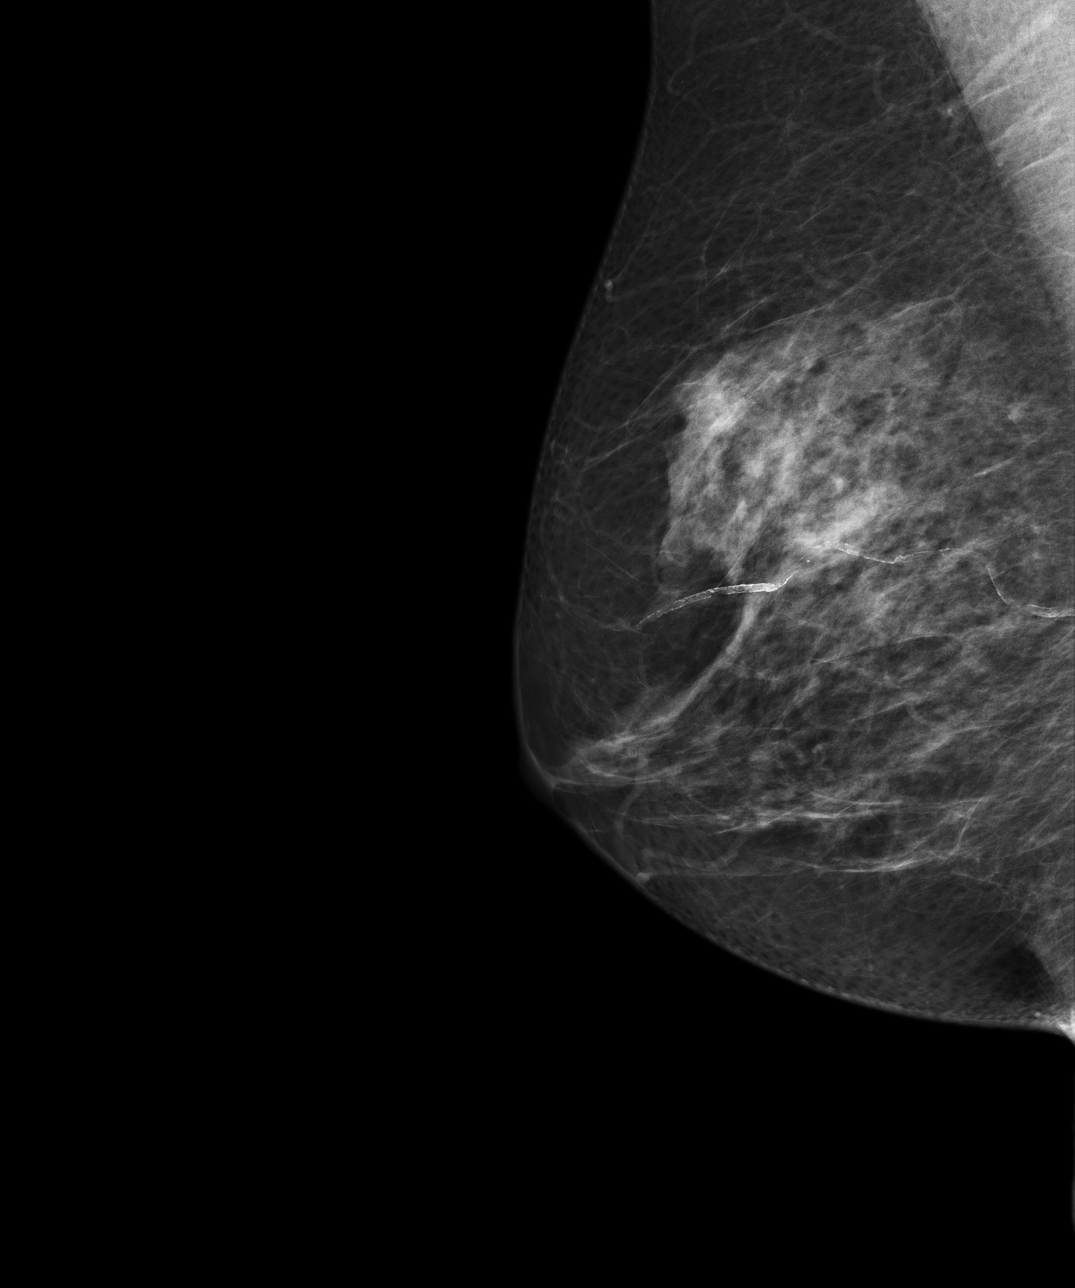
[im 4/4]
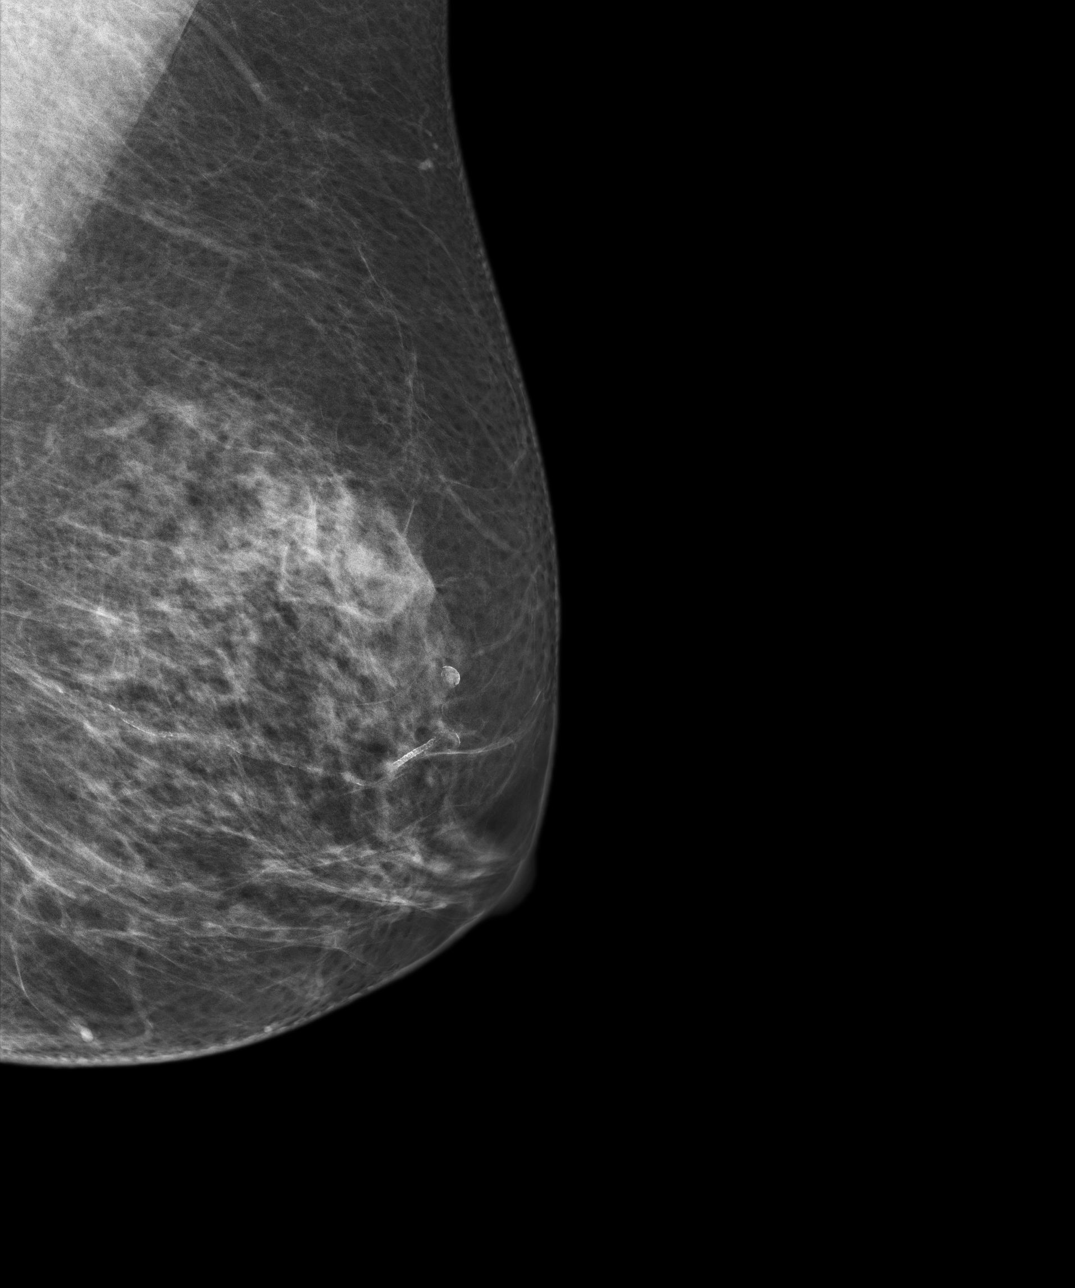

[4 of 4 positions shown; findings below may reference images not displayed]

FINDING: Bilateral breasts demonstrate a scattered fibroglandular density. There is
no dominant mass, architectural distortion or clusters of suspicious
microcalcifications.
IMPRESSION: 1.     Stable bilateral mammogram.
2.     Annual mammographic follow up recommended.
3.     BI-RADS:  Category 2- Benign.

A negative mammogram report does not preclude biopsy or other evaluation of
a clinically palpable or otherwise suspicious mass or lesion. Breast cancer
may not be detected by mammography in up to 10% of cases.

[REDACTED]

## 2014-04-15 DIAGNOSIS — G309 Alzheimer's disease, unspecified: Secondary | ICD-10-CM | POA: Diagnosis not present

## 2014-04-15 DIAGNOSIS — F4323 Adjustment disorder with mixed anxiety and depressed mood: Secondary | ICD-10-CM

## 2014-06-14 ENCOUNTER — Ambulatory Visit: Payer: Self-pay | Admitting: Internal Medicine

## 2014-08-01 DIAGNOSIS — R509 Fever, unspecified: Secondary | ICD-10-CM | POA: Diagnosis not present

## 2014-08-18 DIAGNOSIS — R262 Difficulty in walking, not elsewhere classified: Secondary | ICD-10-CM | POA: Diagnosis not present

## 2014-08-19 DIAGNOSIS — R262 Difficulty in walking, not elsewhere classified: Secondary | ICD-10-CM | POA: Diagnosis not present

## 2014-08-20 DIAGNOSIS — R262 Difficulty in walking, not elsewhere classified: Secondary | ICD-10-CM | POA: Diagnosis not present

## 2014-08-23 DIAGNOSIS — R262 Difficulty in walking, not elsewhere classified: Secondary | ICD-10-CM | POA: Diagnosis not present

## 2014-08-24 DIAGNOSIS — R262 Difficulty in walking, not elsewhere classified: Secondary | ICD-10-CM | POA: Diagnosis not present

## 2014-08-25 DIAGNOSIS — R262 Difficulty in walking, not elsewhere classified: Secondary | ICD-10-CM | POA: Diagnosis not present

## 2014-08-26 DIAGNOSIS — R262 Difficulty in walking, not elsewhere classified: Secondary | ICD-10-CM | POA: Diagnosis not present

## 2014-08-27 DIAGNOSIS — R262 Difficulty in walking, not elsewhere classified: Secondary | ICD-10-CM | POA: Diagnosis not present

## 2014-08-29 DIAGNOSIS — R262 Difficulty in walking, not elsewhere classified: Secondary | ICD-10-CM | POA: Diagnosis not present

## 2014-08-30 DIAGNOSIS — R262 Difficulty in walking, not elsewhere classified: Secondary | ICD-10-CM | POA: Diagnosis not present

## 2014-08-31 DIAGNOSIS — G309 Alzheimer's disease, unspecified: Secondary | ICD-10-CM | POA: Diagnosis not present

## 2014-08-31 DIAGNOSIS — F4323 Adjustment disorder with mixed anxiety and depressed mood: Secondary | ICD-10-CM | POA: Diagnosis not present

## 2014-08-31 DIAGNOSIS — R262 Difficulty in walking, not elsewhere classified: Secondary | ICD-10-CM | POA: Diagnosis not present

## 2014-09-01 DIAGNOSIS — R262 Difficulty in walking, not elsewhere classified: Secondary | ICD-10-CM | POA: Diagnosis not present

## 2014-09-02 DIAGNOSIS — R262 Difficulty in walking, not elsewhere classified: Secondary | ICD-10-CM | POA: Diagnosis not present

## 2014-09-02 NOTE — Discharge Summary (Signed)
PATIENT NAME:  Kristin Woods, Kristin Woods MR#:  [id number] DATE OF BIRTH:  10-11-1923  DATE OF ADMISSION:  04/02/2013 DATE OF DISCHARGE:  04/06/2013  BRIEF HISTORY: Ms. Lashanta Elbe is an 81 year old woman admitted through the Emergency Room with signs and symptoms consistent with acute cholecystitis. She had a HIDA scan on the day of admission which demonstrated no gallbladder filling suggestive of acute biliary tract disease. We discussed surgical options with her and she elected to proceed with surgical intervention. She was taken to surgery on the morning of 04/03/2013 when she underwent a laparoscopic cholecystectomy. She did have acute cholecystitis. The gallbladder was extremely difficult to remove laparoscopically but dissection was accomplished without difficulty. Cholangiography was not performed. She began to improve almost immediately. She had no bile in her JP drains. She was up, active, tolerating a liquid diet with no particular complaints. Her wounds looked good and she was discharged home on the 25th to be followed in the office in 7 to 10 days' time. Bathing, activity and driving instructions were given to the patient.   DISCHARGE MEDICATIONS: Include glucosamine and chondroitin b.i.d., lecithin 1200 mg at bedtime, biotin 10,000 mcg once a day, DHEA 50 mg once a day, calcium 600 two tablets once a day, fish oil 1000 mg 2 caplets b.i.d., Lipitor 10 mg once a day, vitamin B1 once a day, vitamin B12 once a day, vitamin B6 100 mg once a day, vitamin C 500 mg once a day, vitamin E 400 once a day, temazepam 15 mg at bedtime, amlodipine 5 mg once a day, furosemide 20 mg once a day and acetaminophen/hydrocodone 325/5 mg every 4 hours p.r.n.   FINAL DISCHARGE DIAGNOSIS: Acute cholecystitis.   PROCEDURE: Laparoscopic cholecystectomy.   ____________________________ Micheline Maze, MD rle:cs D: 04/13/2013 19:54:58 ET T: 04/13/2013 20:10:08 ET JOB#: [id number]  cc: Micheline Maze, MD, <Dictator> Irven Easterly. Kary Kos, [place] MD ELECTRONICALLY SIGNED 04/14/2013 19:59

## 2014-09-02 NOTE — Op Note (Signed)
PATIENT NAME:  Kristin Woods, BREGE MR#:  [id number] DATE OF BIRTH:  November 07, 1923  DATE OF PROCEDURE:  04/03/2013  PREOPERATIVE DIAGNOSIS: Acute acalculous cholecystitis.   POSTOPERATIVE DIAGNOSIS:  Acute acalculous cholecystitis.   PROCEDURE PERFORMED: Laparoscopic cholecystectomy.   SURGEON: Calvert Cantor. [place]   ANESTHESIA: General.  OPERATIVE PROCEDURE:  With the patient in the supine position, after the induction of appropriate generalized anesthesia, the patient's abdomen was prepped with ChloraPrep and draped with sterile towels. The patient was placed in the head down, feet up position. A small infraumbilical incision was made in the standard fashion and carried down bluntly through the subcutaneous tissue. A Veress needle was used to cannulate the peritoneal cavity. CO2 was insufflated to appropriate pressure measurements. When approximately 2.5 liters of CO2 were instilled, the Veress needle was withdrawn. An 11 mm [employer] port was inserted into the peritoneal cavity. Intraperitoneal position was confirmed. CO2 was reinsufflated. The patient was placed in the head up, feet down position and rotated slightly to the left side.  Subxiphoid transverse incision was made an 11 mm port inserted under direct vision. Two lateral ports 5 mm in size were inserted under direct vision.  The markedly edematous and distended.  It was aspirated of approximately 50 mL of clear bile. There did not appeared to be any evidence of gangrene or necrosis. The gallbladder was retracted superiorly and laterally. Multiple lesions were taken out over the gallbladder and hepatoduodenal ligament.  The cystic duct and cystic artery were very difficult to identify. Tedious dissection was required. I began in what appeared to be the body of the gallbladder and dissected down in order to find what appeared to be the base of the gallbladder ending in what looked like the cystic duct. The cystic artery was doubly clipped and divided. A  very large right hepatic artery was identified. The common duct was identified. Because of the inflammatory change and distention around the cystic duct, the xiphoid incision was slightly enlarged and the ports changed for 12 mm port. An Endo GIA stapler was brought to the table and the cystic duct divided right with the junction of the gallbladder using the stapling device. Blue load was utilized. The gallbladder was then dissected free of its bed with the hook and cautery apparatus.  Once the gallbladder was free, the gallbladder was captured and the catch apparatus and removed through the subxiphoid port.  A drain was placed because of the significant dissection through the upper midline port brought out through one of the lateral stab wounds.  This was secured in placed with 3-0 nylon.  The abdomen was desufflated. All ports were withdrawn without difficulty. Fascial incisions in the midline were closed with 0 Vicryl in interrupted fashion. The skin was closed with 5-0 nylon. The area was infiltrated with 0.25% Marcaine for postoperative pain control. Sterile dressing was applied.  The patient was returned to the recovery room having tolerated the procedure well. Sponge, instrument, and needle count were correct x 2 in the operating room.      ____________________________ Micheline Maze, MD rle:dp D: 04/03/2013 14:32:45 ET T: 04/03/2013 15:59:27 ET JOB#: [id number]  cc: Micheline Maze, MD, <Dictator> Irven Easterly. Kary Kos, [place] MD ELECTRONICALLY SIGNED 04/13/2013 0:34

## 2014-09-02 NOTE — Consult Note (Signed)
PATIENT NAME:  FELIPE, CABELL MR#:  [id number] DATE OF BIRTH:  08/21/1923  DATE OF CONSULTATION:  04/02/2013  REFERRING PHYSICIAN:   CONSULTING PHYSICIAN:  Malakie Balis A. Eryca Bolte, MD  REASON FOR CONSULTATION:  Nausea and vomiting x 1 day, epigastric pain.   HISTORY OF PRESENT ILLNESS:  Ms. Tabares is a pleasant 81 year old female with a history of multiple orthopedic surgeries who presents with nausea and vomiting x 1 day and epigastric pain.  She is a poor historian and initially when I asked she said she did not know why she was here.  She does say that she has epigastric pain, a total length of time unknown.  She feels that she may have had this before in the past.  Nausea, vomiting were multiple episodes earlier, not currently nauseated or vomiting, having regular bowel movements.  No fevers or chills.  Has never gone and investigated her epigastric pain before.  No sick contacts.  No fevers, chills, night sweats, shortness of breath, cough, chest pain, dysuria, hematuria.   PAST MEDICAL HISTORY: 1.  History of right and left hip replacements.  2.  History of degenerative disc disease.  3.  History of urinary incontinence.  4.  History of anemia.  5.  History of hand surgery.  6.  History of pilonidal cystectomy.  7.  History of right shoulder surgery.  8.  History of cataract surgery both eyes.   HOME MEDICATIONS: 1.  Vitamin E.  2.  Vitamin C.  3.  Vitamin B6.  4.  Vitamin B1.  5.  Vitamin B12.  6.  Temazepam 15 mg by mouth at bedtime.  7.  Lipitor 10 mg by mouth daily. 8.  1200 mg by mouth at bedtime.   9.  Glucosamine and chondroitin with MSN 1 tab twice daily.  10.  Lasix 20 mg by mouth daily.  11.  Fish oil.  12.  DHEA.  13.  Calcium. 14.  Biotin.  15.  Norvasc 5 mg by mouth daily.  ALLERGIES:  TAPE, ALTHOUGH SHE DOES NOT ENDORSE THIS.   SOCIAL HISTORY:  Denies tobacco or alcohol use.  Lives at [facility].   FAMILY HISTORY:  Diabetes, coronary artery disease.  Does  not believe she has a cancer history.   REVIEW OF SYSTEMS:  A 12 point review of systems was obtained.  Pertinent positives and negatives as above.   PHYSICAL EXAMINATION: VITAL SIGNS:  Temperature 97.4, pulse 90, blood pressure 170/84, respirations 18, 97% on room air.  GENERAL:  No acute distress.  Alert and oriented x 3.  HEAD:  Normocephalic, atraumatic.  EYES:  No scleral icterus.  No conjunctivitis.  FACE:  No obvious facial trauma.  Normal external nose.  Normal external ears.  CHEST:  Lungs clear to auscultation.  Moving air well.  HEART:  Regular rate and rhythm.  No murmurs, rubs or gallops.  ABDOMEN:  Soft, mildly tender to palpation epigastrium and right upper quadrant, nondistended.  EXTREMITIES:  Moves all extremities well.  Strength 5 out of 5.  NEUROLOGIC:  Cranial nerves II through XII grossly intact.  Sensation intact in all four extremities.   LABORATORY DATA:  Significant for a white cell count of 12.3.  Lipase of 381, potassium 3.3.   CT shows a mildly thickened gallbladder wall.  Ultrasound shows a thickened gallbladder wall, gallstones.   ASSESSMENT AND PLAN:  Ms. Goracke is a pleasant 81 year old female who presents with nausea, vomiting, epigastric pain.  Her story was somewhat hard  to listen and is somewhat equivocal.  Her findings do suggest acute acalculous cholecystitis.  We will admit for resuscitation, labs, possible cholecystectomy.  We will discuss with daytime surgeon.  If still tender will consider HIDA scan.     ____________________________ Glena Norfolk. Chaylee Ehrsam, MD cal:ea D: 04/02/2013 02:08:56 ET T: 04/02/2013 02:28:31 ET JOB#: [id number]  cc: Harrell Gave A. Lexton Hidalgo, MD, <Dictator> Floyde Parkins MD ELECTRONICALLY SIGNED 04/10/2013 19:38

## 2014-09-03 NOTE — Discharge Summary (Signed)
PATIENT NAME:  Kristin Woods, Kristin Woods MR#:  [id number] DATE OF BIRTH:  Nov 18, 1923  DATE OF ADMISSION:  01/14/2014  DATE OF DISCHARGE:  01/15/2014  FINAL DIAGNOSES:  1.  Orthostatic hypotension.  2.  Initially suspected urinary tract infection.  3.  Initial hyperkalemia then hypokalemia.  4.  Hypomagnesemia.  5.  Hyperlipidemia.  6.  Anxiety.  7.  Dementia.   MEDICATIONS ON DISCHARGE: Include temazepam 15 mg at bedtime, atorvastatin 10 mg at bedtime, Ativan 0.5 mg every 8 hours as needed for anxiety and nervousness, ibuprofen 200 mg once a day as needed for pain, multivitamin 1 tablet daily, calcium and vitamin D 600 mg/200 international units 2 tablets daily, Aricept 5 mg at bedtime, Deep Sea nasal spray 0.65 nasal spray, 2 sprays nasally every 4 hours as needed for dry nasal passages.  Stop Lasix, amlodipine, DHEA, fish oil, chondroitin and glucosamine, lecithin.    FOLLOW-UP:   1 to 2 days with Dr. Kary Kos.  Follow-up with physical therapy.   DIET: Regular diet, regular consistency.   HOSPITAL COURSE: The patient was admitted 01/13/2014 and discharged 01/15/2014.  He came in with weakness, dizziness, possible UTI.  The patient was admitted for possible UTI with positive urinalysis started on IV ceftriaxone for orthostatic hypotension.  She was given IV fluids and Lasix and Norvasc were held.  For her hyperkalemia, they thought it was a lab error and repeated the potassium in the morning after IV fluid hydration and it improved.   LABORATORY AND RADIOLOGICAL DATA:  During the hospital course included a troponin that was negative. Glucose 94, BUN 18, creatinine 0.89, sodium 134, potassium 5.6, chloride 101, CO2 of 22, calcium 9.7. White blood cell count 10.2, hemoglobin and hematocrit 13.4 and 40.7, platelet count of 318.   EKG, normal sinus rhythm, no acute ST-T wave changes.  Urinalysis 3+ leukocyte esterase, nitrites were negative. Urine culture was negative.  Blood cultures negative, TSH 1.78.  A  repeat potassium 3.1, magnesium on September 5 at 1.6, potassium 3.2.   HOSPITAL COURSE PER PROBLEM LIST:  1.  For the patient's orthostatic hypotension. The patient was given IV fluid hydration and Norvasc and Lasix were held.  I will continue to hold that upon discharge.  I would rather have her blood pressure higher do that when she does drop down, she does not drop down too low.  Lasix will dehydrate her further and deplete her electrolytes if this was stopped indefinitely.  2.  Initial hyperkalemia then hypokalemia.  Hypokalemia was replaced during the hospital stay.  3.  Hypomagnesemia. This was replaced during the hospital stay.  4.  Suspected urinary tract infection. A urine culture was negative. The patient did receive Rocephin while here. Since the urine culture was negative, antibiotics were not given upon discharge.  5.  Hyperlipidemia. Continue statin.  6.  Dementia and anxiety.  Continue temazepam and p.r.n. Ativan.  TIME SPENT ON DISCHARGE:  35 minutes. The patient will be discharged to [facility] skilled nursing facility today, 01/15/2014.     ____________________________ Tana Conch. Leslye Peer, MD rjw:DT D: 01/15/2014 08:18:46 ET T: 01/15/2014 08:45:18 ET JOB#: [id number]  cc: Tana Conch. Leslye Peer, MD, <Dictator> Irven Easterly. Kary Kos, [place] MD ELECTRONICALLY SIGNED 01/25/2014 12:25

## 2014-09-03 NOTE — H&P (Signed)
PATIENT NAME:  Kristin Woods, Kristin Woods MR#:  [id number] DATE OF BIRTH:  February 21, 1924  DATE OF ADMISSION:  01/13/2014  PRIMARY CARE PROVIDER: Irven Easterly. Kary Kos, MD  EMERGENCY DEPARTMENT REFERRING PHYSICIAN: Jon Gills. Lord, MD  CHIEF COMPLAINT: Weakness, dizziness and UTI.   HISTORY OF PRESENT ILLNESS: The patient is an 81 year old female with multiple medical problems, who was recently hospitalized and discharged from a geriatric psychiatric unit for depression and suicidal ideation, who is sent from the [facility] for the patient feeling very weak and dizzy. The patient had some orthostatic hypotension as well. She also has been recently treated for a UTI and still has an abnormal urinary tract infection. The patient herself likely has some dementia and states that she is feeling fine now, but was feeling weak earlier, but other than that is unable to give me any other history.   PAST MEDICAL HISTORY:  1.  Significant for history of urinary incontinence. 2.  History of anemia. 3.  History of hyperlipidemia. 4.  Degenerative joint disease.  5.  Status post right and left hip replacement.  6.  Status post hip surgery.  7.  Status post pilonidal cystectomy.  8.  History of right shoulder surgery. 9.  Status post bilateral cataract surgery.  10.  Status post laparoscopic cholecystectomy.   ALLERGIES: TAPE AND ETODOLAC.   MEDICATIONS: Temazepam 15 one tablet p.o. at bedtime, vitamin B complex 1 tablet p.o. daily, multivitamin daily, Lecithin 1200 mg daily, ibuprofen 200 once a day as needed, Lasix 20 daily, fish oil 1000 mg daily, donepezil 5 mg daily, DHEA 50 mg daily, Deep Sea nasal spray 2 sprays every 4 hours as needed, Cipro 25 one tablet p.o. b.i.d., chondroitin/glucosamine 1 tablet p.o. b.i.d., calcium plus vitamin D 2 tablets daily, atorvastatin 10 daily, Ativan 0.5 mg 1 tablet p.o. q. 8 p.r.n., amlodipine 5 daily.   SOCIAL HISTORY: Does not smoke. Does not drink. No drugs. Lives  in assisted living facility.   FAMILY HISTORY: Diabetes, coronary artery disease.   REVIEW OF SYSTEMS: CONSTITUTIONAL: Denies any fevers; was complaining of fatigue and weakness earlier. No weight gain, weight loss.  EYES: No blurred or double vision. No redness. No inflammation. History of cataracts.  EARS, NOSE AND THROAT: No tinnitus. No ear pain. No hearing loss. No seasonal or year-round allergies. No epistaxis. No difficulty swallowing.  RESPIRATORY: Denies any cough, wheezing, hemoptysis. No COPD.  CARDIOVASCULAR: Denies any chest pain, orthopnea, edema or arrhythmia. No syncope.  GASTROINTESTINAL: No nausea, vomiting, diarrhea. No abdominal pain. No hematemesis. No melena. No guarding. No IBS. No jaundice. No rectal bleeding.  GENITOURINARY: Denies any dysuria, hematuria, renal calculus, frequency.  ENDOCRINOLOGY: Denies any polyuria, nocturia or thyroid problems.  HEMATOLOGY AND LYMPHATICS: Has a history of anemia but no easy bruisability or bleeding. SKIN: No acne. No rash.  MUSCULOSKELETAL: Denies any pain in neck, back or shoulder.  NEUROLOGIC: No CVA, TIA or seizures.  PSYCHIATRIC: History of anxiety, depression,   PHYSICAL EXAMINATION:  VITAL SIGNS: Temperature 98.4, pulse 68, respirations 18, blood pressure 127/80, O2 at 94%.  GENERAL: The patient is a well-developed, well-nourished female in no acute distress.  HEENT: Head atraumatic, normocephalic. Pupils equally round, reactive to light and accommodation. There is no conjunctival pallor. No scleral icterus. Nasal exam shows no drainage or ulceration. Oropharynx is clear without any exudate.  NECK: Supple without any JVD. No thyromegaly.  CARDIOVASCULAR: Regular rate and rhythm. No murmurs, rubs, clicks or gallops. PMI is not displaced.  LUNGS: Clear to auscultation bilaterally without any rales, rhonchi, wheezing.  ABDOMEN: Soft, nontender, nondistended. Positive bowel sounds x 4. No hepatosplenomegaly.  EXTREMITIES: No  clubbing, cyanosis, edema.  SKIN: No rash.  LYMPHATICS: No lymph nodes palpable.  VASCULAR: Good DP, PT pulses.  PSYCHIATRIC: Not anxious or depressed.  NEUROLOGICAL: Awake, alert, oriented x 3. No focal deficits.   LABORATORY DATA: Glucose 94, BUN 18, creatinine 0.89, sodium 134, potassium 5.6, chloride 101, CO2 of 22, calcium 9.7. WBC 10.2, hemoglobin 13.4, platelet count 318,000. Urinalysis: Leukocytes 3+, nitrites negative.   ASSESSMENT AND PLAN: The patient is an 81 year old white female who lives in assisted living, is having some dizziness, weakness, refractory urinary tract infection. 1.  Urinary tract infection, failed outpatient therapy. At this time, we will treat her with IV ceftriaxone, obtain urine cultures.  2.  Hyperlipidemia. Continue pravastatin as taking at home.  3.  Hyperkalemia, likely a lab error. We will repeat her potassium in the morning.  4.  Orthostatic hypotension. We will give her IV fluids, hold Lasix and Norvasc.  5.  Anxiety disorder. We will continue p.r.n. Ativan.  6.  Miscellaneous: We will do Lovenox for deep venous thrombosis prophylaxis. I will also obtain physical therapy to evaluate the patient and ambulate.   TIME SPENT: Fifty minutes.    ____________________________ Lafonda Mosses Posey Pronto, MD shp:TT D: 01/13/2014 19:18:07 ET T: 01/13/2014 19:58:07 ET JOB#: [id number]  cc: Quinto Tippy H. Posey Pronto, MD, <Dictator> Alric Seton MD ELECTRONICALLY SIGNED 01/16/2014 11:50

## 2014-10-12 DIAGNOSIS — G309 Alzheimer's disease, unspecified: Secondary | ICD-10-CM | POA: Diagnosis not present

## 2014-10-12 DIAGNOSIS — F4323 Adjustment disorder with mixed anxiety and depressed mood: Secondary | ICD-10-CM | POA: Diagnosis not present

## 2014-10-19 DIAGNOSIS — M6281 Muscle weakness (generalized): Secondary | ICD-10-CM | POA: Diagnosis not present

## 2014-10-19 DIAGNOSIS — R262 Difficulty in walking, not elsewhere classified: Secondary | ICD-10-CM | POA: Diagnosis not present

## 2014-10-19 DIAGNOSIS — F0280 Dementia in other diseases classified elsewhere without behavioral disturbance: Secondary | ICD-10-CM | POA: Diagnosis not present

## 2014-10-20 DIAGNOSIS — R262 Difficulty in walking, not elsewhere classified: Secondary | ICD-10-CM | POA: Diagnosis not present

## 2014-10-20 DIAGNOSIS — M6281 Muscle weakness (generalized): Secondary | ICD-10-CM | POA: Diagnosis not present

## 2014-10-20 DIAGNOSIS — F0280 Dementia in other diseases classified elsewhere without behavioral disturbance: Secondary | ICD-10-CM | POA: Diagnosis not present

## 2014-10-21 DIAGNOSIS — R262 Difficulty in walking, not elsewhere classified: Secondary | ICD-10-CM | POA: Diagnosis not present

## 2014-10-21 DIAGNOSIS — M6281 Muscle weakness (generalized): Secondary | ICD-10-CM | POA: Diagnosis not present

## 2014-10-21 DIAGNOSIS — F0280 Dementia in other diseases classified elsewhere without behavioral disturbance: Secondary | ICD-10-CM | POA: Diagnosis not present

## 2014-10-24 DIAGNOSIS — R262 Difficulty in walking, not elsewhere classified: Secondary | ICD-10-CM | POA: Diagnosis not present

## 2014-10-24 DIAGNOSIS — F0280 Dementia in other diseases classified elsewhere without behavioral disturbance: Secondary | ICD-10-CM | POA: Diagnosis not present

## 2014-10-24 DIAGNOSIS — M6281 Muscle weakness (generalized): Secondary | ICD-10-CM | POA: Diagnosis not present

## 2014-10-25 DIAGNOSIS — F0280 Dementia in other diseases classified elsewhere without behavioral disturbance: Secondary | ICD-10-CM | POA: Diagnosis not present

## 2014-10-25 DIAGNOSIS — R262 Difficulty in walking, not elsewhere classified: Secondary | ICD-10-CM | POA: Diagnosis not present

## 2014-10-25 DIAGNOSIS — M6281 Muscle weakness (generalized): Secondary | ICD-10-CM | POA: Diagnosis not present

## 2014-10-26 DIAGNOSIS — F0280 Dementia in other diseases classified elsewhere without behavioral disturbance: Secondary | ICD-10-CM | POA: Diagnosis not present

## 2014-10-26 DIAGNOSIS — R262 Difficulty in walking, not elsewhere classified: Secondary | ICD-10-CM | POA: Diagnosis not present

## 2014-10-26 DIAGNOSIS — M6281 Muscle weakness (generalized): Secondary | ICD-10-CM | POA: Diagnosis not present

## 2014-10-27 DIAGNOSIS — R262 Difficulty in walking, not elsewhere classified: Secondary | ICD-10-CM | POA: Diagnosis not present

## 2014-10-27 DIAGNOSIS — M6281 Muscle weakness (generalized): Secondary | ICD-10-CM | POA: Diagnosis not present

## 2014-10-27 DIAGNOSIS — F0280 Dementia in other diseases classified elsewhere without behavioral disturbance: Secondary | ICD-10-CM | POA: Diagnosis not present

## 2014-10-28 DIAGNOSIS — M6281 Muscle weakness (generalized): Secondary | ICD-10-CM | POA: Diagnosis not present

## 2014-10-28 DIAGNOSIS — F0280 Dementia in other diseases classified elsewhere without behavioral disturbance: Secondary | ICD-10-CM | POA: Diagnosis not present

## 2014-10-28 DIAGNOSIS — R262 Difficulty in walking, not elsewhere classified: Secondary | ICD-10-CM | POA: Diagnosis not present

## 2014-10-31 DIAGNOSIS — M6281 Muscle weakness (generalized): Secondary | ICD-10-CM | POA: Diagnosis not present

## 2014-10-31 DIAGNOSIS — R262 Difficulty in walking, not elsewhere classified: Secondary | ICD-10-CM | POA: Diagnosis not present

## 2014-10-31 DIAGNOSIS — F0280 Dementia in other diseases classified elsewhere without behavioral disturbance: Secondary | ICD-10-CM | POA: Diagnosis not present

## 2014-11-01 DIAGNOSIS — M6281 Muscle weakness (generalized): Secondary | ICD-10-CM | POA: Diagnosis not present

## 2014-11-01 DIAGNOSIS — F0280 Dementia in other diseases classified elsewhere without behavioral disturbance: Secondary | ICD-10-CM | POA: Diagnosis not present

## 2014-11-01 DIAGNOSIS — R262 Difficulty in walking, not elsewhere classified: Secondary | ICD-10-CM | POA: Diagnosis not present

## 2014-11-02 DIAGNOSIS — F0280 Dementia in other diseases classified elsewhere without behavioral disturbance: Secondary | ICD-10-CM | POA: Diagnosis not present

## 2014-11-02 DIAGNOSIS — M6281 Muscle weakness (generalized): Secondary | ICD-10-CM | POA: Diagnosis not present

## 2014-11-02 DIAGNOSIS — R262 Difficulty in walking, not elsewhere classified: Secondary | ICD-10-CM | POA: Diagnosis not present

## 2014-11-03 DIAGNOSIS — R262 Difficulty in walking, not elsewhere classified: Secondary | ICD-10-CM | POA: Diagnosis not present

## 2014-11-03 DIAGNOSIS — F0280 Dementia in other diseases classified elsewhere without behavioral disturbance: Secondary | ICD-10-CM | POA: Diagnosis not present

## 2014-11-03 DIAGNOSIS — M6281 Muscle weakness (generalized): Secondary | ICD-10-CM | POA: Diagnosis not present

## 2014-11-04 DIAGNOSIS — M6281 Muscle weakness (generalized): Secondary | ICD-10-CM | POA: Diagnosis not present

## 2014-11-04 DIAGNOSIS — R262 Difficulty in walking, not elsewhere classified: Secondary | ICD-10-CM | POA: Diagnosis not present

## 2014-11-04 DIAGNOSIS — F0280 Dementia in other diseases classified elsewhere without behavioral disturbance: Secondary | ICD-10-CM | POA: Diagnosis not present

## 2014-11-07 DIAGNOSIS — R262 Difficulty in walking, not elsewhere classified: Secondary | ICD-10-CM | POA: Diagnosis not present

## 2014-11-07 DIAGNOSIS — M6281 Muscle weakness (generalized): Secondary | ICD-10-CM | POA: Diagnosis not present

## 2014-11-07 DIAGNOSIS — F0280 Dementia in other diseases classified elsewhere without behavioral disturbance: Secondary | ICD-10-CM | POA: Diagnosis not present

## 2014-11-08 DIAGNOSIS — M6281 Muscle weakness (generalized): Secondary | ICD-10-CM | POA: Diagnosis not present

## 2014-11-08 DIAGNOSIS — R262 Difficulty in walking, not elsewhere classified: Secondary | ICD-10-CM | POA: Diagnosis not present

## 2014-11-08 DIAGNOSIS — F0280 Dementia in other diseases classified elsewhere without behavioral disturbance: Secondary | ICD-10-CM | POA: Diagnosis not present

## 2014-11-10 DIAGNOSIS — R262 Difficulty in walking, not elsewhere classified: Secondary | ICD-10-CM | POA: Diagnosis not present

## 2014-11-10 DIAGNOSIS — M6281 Muscle weakness (generalized): Secondary | ICD-10-CM | POA: Diagnosis not present

## 2014-11-10 DIAGNOSIS — F0280 Dementia in other diseases classified elsewhere without behavioral disturbance: Secondary | ICD-10-CM | POA: Diagnosis not present

## 2014-12-14 DIAGNOSIS — G309 Alzheimer's disease, unspecified: Secondary | ICD-10-CM | POA: Diagnosis not present

## 2014-12-14 DIAGNOSIS — F4323 Adjustment disorder with mixed anxiety and depressed mood: Secondary | ICD-10-CM | POA: Diagnosis not present

## 2015-01-09 DIAGNOSIS — R279 Unspecified lack of coordination: Secondary | ICD-10-CM | POA: Diagnosis not present

## 2015-01-09 DIAGNOSIS — R269 Unspecified abnormalities of gait and mobility: Secondary | ICD-10-CM | POA: Diagnosis not present

## 2015-01-10 DIAGNOSIS — R279 Unspecified lack of coordination: Secondary | ICD-10-CM | POA: Diagnosis not present

## 2015-01-10 DIAGNOSIS — R269 Unspecified abnormalities of gait and mobility: Secondary | ICD-10-CM | POA: Diagnosis not present

## 2015-01-12 DIAGNOSIS — I951 Orthostatic hypotension: Secondary | ICD-10-CM | POA: Diagnosis not present

## 2015-01-12 DIAGNOSIS — R269 Unspecified abnormalities of gait and mobility: Secondary | ICD-10-CM | POA: Diagnosis not present

## 2015-01-12 DIAGNOSIS — R279 Unspecified lack of coordination: Secondary | ICD-10-CM | POA: Diagnosis not present

## 2015-01-12 DIAGNOSIS — M25511 Pain in right shoulder: Secondary | ICD-10-CM | POA: Diagnosis not present

## 2015-01-12 DIAGNOSIS — F0390 Unspecified dementia without behavioral disturbance: Secondary | ICD-10-CM | POA: Diagnosis not present

## 2015-01-16 DIAGNOSIS — M25511 Pain in right shoulder: Secondary | ICD-10-CM | POA: Diagnosis not present

## 2015-01-16 DIAGNOSIS — F0390 Unspecified dementia without behavioral disturbance: Secondary | ICD-10-CM | POA: Diagnosis not present

## 2015-01-16 DIAGNOSIS — R269 Unspecified abnormalities of gait and mobility: Secondary | ICD-10-CM | POA: Diagnosis not present

## 2015-01-16 DIAGNOSIS — I951 Orthostatic hypotension: Secondary | ICD-10-CM | POA: Diagnosis not present

## 2015-01-16 DIAGNOSIS — R279 Unspecified lack of coordination: Secondary | ICD-10-CM | POA: Diagnosis not present

## 2015-01-18 DIAGNOSIS — R269 Unspecified abnormalities of gait and mobility: Secondary | ICD-10-CM | POA: Diagnosis not present

## 2015-01-18 DIAGNOSIS — F0390 Unspecified dementia without behavioral disturbance: Secondary | ICD-10-CM | POA: Diagnosis not present

## 2015-01-18 DIAGNOSIS — I951 Orthostatic hypotension: Secondary | ICD-10-CM | POA: Diagnosis not present

## 2015-01-18 DIAGNOSIS — M25511 Pain in right shoulder: Secondary | ICD-10-CM | POA: Diagnosis not present

## 2015-01-18 DIAGNOSIS — R279 Unspecified lack of coordination: Secondary | ICD-10-CM | POA: Diagnosis not present

## 2015-01-19 DIAGNOSIS — R279 Unspecified lack of coordination: Secondary | ICD-10-CM | POA: Diagnosis not present

## 2015-01-19 DIAGNOSIS — F0390 Unspecified dementia without behavioral disturbance: Secondary | ICD-10-CM | POA: Diagnosis not present

## 2015-01-19 DIAGNOSIS — R269 Unspecified abnormalities of gait and mobility: Secondary | ICD-10-CM | POA: Diagnosis not present

## 2015-01-19 DIAGNOSIS — I951 Orthostatic hypotension: Secondary | ICD-10-CM | POA: Diagnosis not present

## 2015-01-19 DIAGNOSIS — M25511 Pain in right shoulder: Secondary | ICD-10-CM | POA: Diagnosis not present

## 2015-01-23 DIAGNOSIS — I951 Orthostatic hypotension: Secondary | ICD-10-CM | POA: Diagnosis not present

## 2015-01-23 DIAGNOSIS — R269 Unspecified abnormalities of gait and mobility: Secondary | ICD-10-CM | POA: Diagnosis not present

## 2015-01-23 DIAGNOSIS — M25511 Pain in right shoulder: Secondary | ICD-10-CM | POA: Diagnosis not present

## 2015-01-23 DIAGNOSIS — R279 Unspecified lack of coordination: Secondary | ICD-10-CM | POA: Diagnosis not present

## 2015-01-23 DIAGNOSIS — F0390 Unspecified dementia without behavioral disturbance: Secondary | ICD-10-CM | POA: Diagnosis not present

## 2015-01-24 DIAGNOSIS — R269 Unspecified abnormalities of gait and mobility: Secondary | ICD-10-CM | POA: Diagnosis not present

## 2015-01-24 DIAGNOSIS — M25511 Pain in right shoulder: Secondary | ICD-10-CM | POA: Diagnosis not present

## 2015-01-24 DIAGNOSIS — R279 Unspecified lack of coordination: Secondary | ICD-10-CM | POA: Diagnosis not present

## 2015-01-24 DIAGNOSIS — I951 Orthostatic hypotension: Secondary | ICD-10-CM | POA: Diagnosis not present

## 2015-01-24 DIAGNOSIS — F0390 Unspecified dementia without behavioral disturbance: Secondary | ICD-10-CM | POA: Diagnosis not present

## 2015-01-26 DIAGNOSIS — R279 Unspecified lack of coordination: Secondary | ICD-10-CM | POA: Diagnosis not present

## 2015-01-26 DIAGNOSIS — I951 Orthostatic hypotension: Secondary | ICD-10-CM | POA: Diagnosis not present

## 2015-01-26 DIAGNOSIS — F0390 Unspecified dementia without behavioral disturbance: Secondary | ICD-10-CM | POA: Diagnosis not present

## 2015-01-26 DIAGNOSIS — R269 Unspecified abnormalities of gait and mobility: Secondary | ICD-10-CM | POA: Diagnosis not present

## 2015-01-26 DIAGNOSIS — M25511 Pain in right shoulder: Secondary | ICD-10-CM | POA: Diagnosis not present

## 2015-01-31 DIAGNOSIS — R279 Unspecified lack of coordination: Secondary | ICD-10-CM | POA: Diagnosis not present

## 2015-01-31 DIAGNOSIS — R269 Unspecified abnormalities of gait and mobility: Secondary | ICD-10-CM | POA: Diagnosis not present

## 2015-01-31 DIAGNOSIS — F0390 Unspecified dementia without behavioral disturbance: Secondary | ICD-10-CM | POA: Diagnosis not present

## 2015-01-31 DIAGNOSIS — M25511 Pain in right shoulder: Secondary | ICD-10-CM | POA: Diagnosis not present

## 2015-01-31 DIAGNOSIS — I951 Orthostatic hypotension: Secondary | ICD-10-CM | POA: Diagnosis not present

## 2015-02-02 DIAGNOSIS — M25511 Pain in right shoulder: Secondary | ICD-10-CM | POA: Diagnosis not present

## 2015-02-02 DIAGNOSIS — F0390 Unspecified dementia without behavioral disturbance: Secondary | ICD-10-CM | POA: Diagnosis not present

## 2015-02-02 DIAGNOSIS — R279 Unspecified lack of coordination: Secondary | ICD-10-CM | POA: Diagnosis not present

## 2015-02-02 DIAGNOSIS — R269 Unspecified abnormalities of gait and mobility: Secondary | ICD-10-CM | POA: Diagnosis not present

## 2015-02-02 DIAGNOSIS — I951 Orthostatic hypotension: Secondary | ICD-10-CM | POA: Diagnosis not present

## 2015-02-24 DIAGNOSIS — G309 Alzheimer's disease, unspecified: Secondary | ICD-10-CM | POA: Diagnosis not present

## 2015-02-24 DIAGNOSIS — F4323 Adjustment disorder with mixed anxiety and depressed mood: Secondary | ICD-10-CM | POA: Diagnosis not present

## 2015-03-17 IMAGING — NM NUCLEAR MEDICINE HEPATOHBILIARY INCLUDE GB
3 series · 21 of 21 positions shown · non-contrast
Comparison: Ultrasound right upper quadrant April 02, 2013

CLINICAL DATA: Upper abdominal pain with abnormal ultrasound
examination of the gallbladder

EXAM:
NUCLEAR MEDICINE HEPATOBILIARY IMAGING
Views: Anterior right upper quadrant
Radionuclide: Technetium 99 M Choletec
Dose:  8.8 mCi
Route of administration: Intravenous

[Series 1000: gallbladder statics (concatenated) · 4.80mm/px · 11 of 11 slices shown]
[im 1/11]
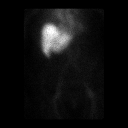
[im 2/11]
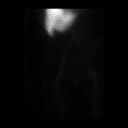
[im 3/11]
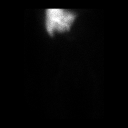
[im 4/11]
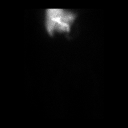
[im 5/11]
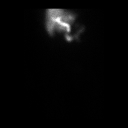
[im 6/11]
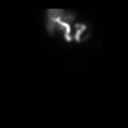
[im 7/11]
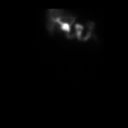
[im 8/11]
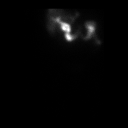
[im 9/11]
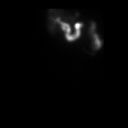
[im 10/11]
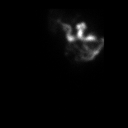
[im 11/11]
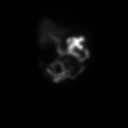

[Series 1000: gallbladder statics · 4.80mm/px · 9 of 9 slices shown (1 of 2)]
[im 1/9]
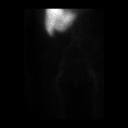
[im 2/9]
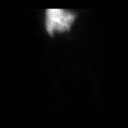
[im 3/9]
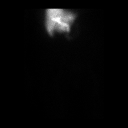
[im 4/9]
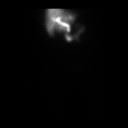
[im 5/9]
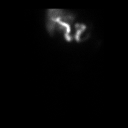
[im 6/9]
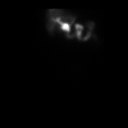
[im 7/9]
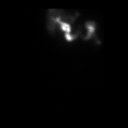
[im 8/9]
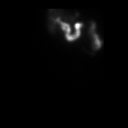
[im 9/9]
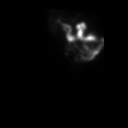

[Series 1000: gallbladder statics · 4.80mm/px · 1 of 1 slices shown (2 of 2)]
[im 1/1]
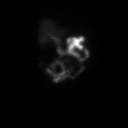

[21 of 21 positions shown; findings below may reference images not displayed]

FINDINGS: Liver uptake of radiotracer is normal. There is prompt visualization
of small bowel, indicating patency of the common bile duct. Images
obtained over a greater than 2 hr time span show no visualization of
the gallbladder.
IMPRESSION: Nonvisualization of gallbladder. This finding is concerning for
cystic duct obstruction and may be indicative of acute
cholecystitis. Common bile duct is patent as is evidenced of prompt
visualization of the small bowel.

## 2015-03-17 IMAGING — US ABDOMEN ULTRASOUND LIMITED
1 series · 14 of 25 positions shown · non-contrast
Comparison: CT from one day prior

CLINICAL DATA: Abdominal pain.

EXAM:
US ABDOMEN LIMITED - RIGHT UPPER QUADRANT

[Series 1: abdomen ultrasound limited · 0.22mm/px · 14 of 37 slices shown]
[im 1/37]
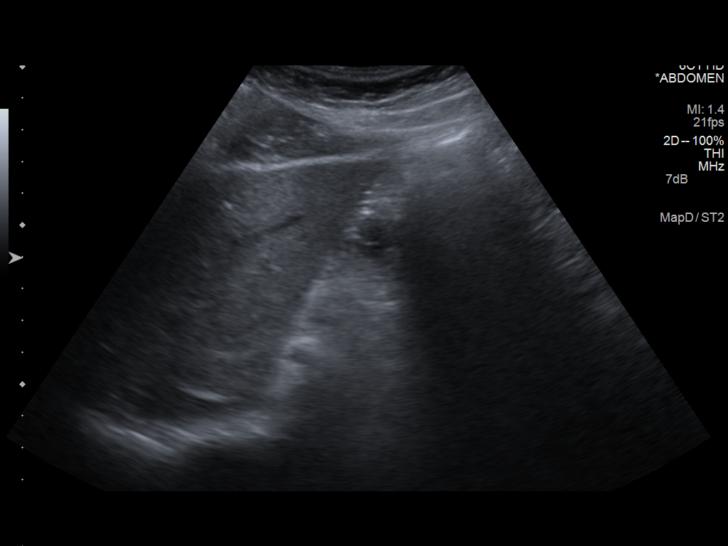
[im 4/37]
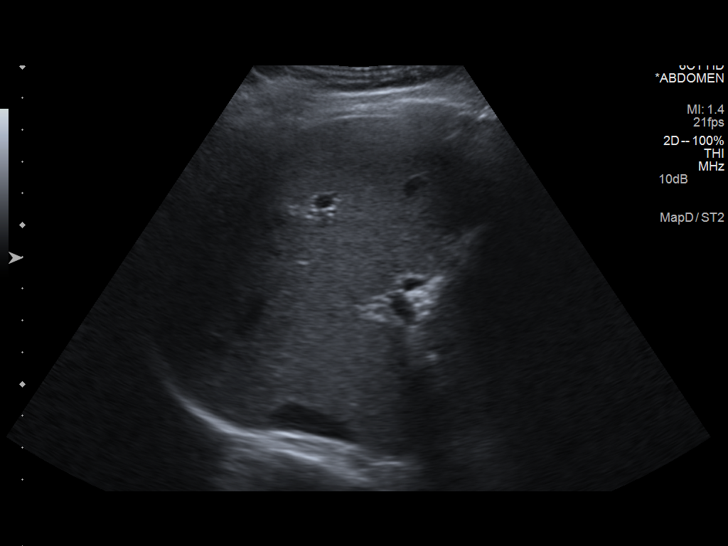
[im 7/37]
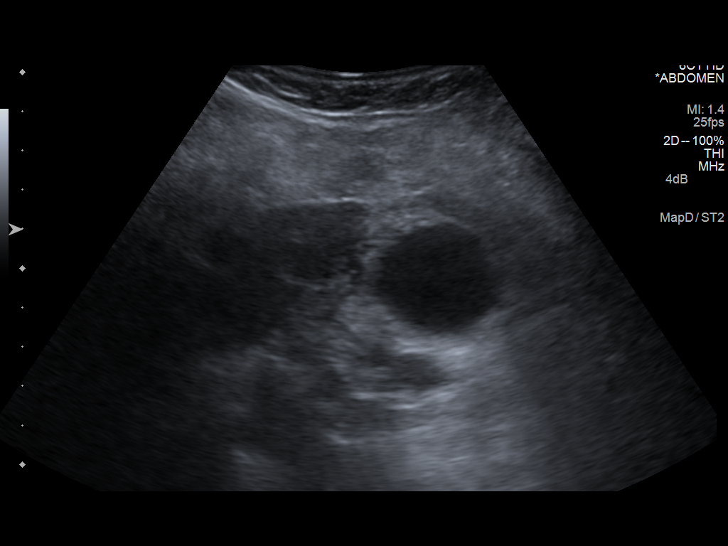
[im 10/37]
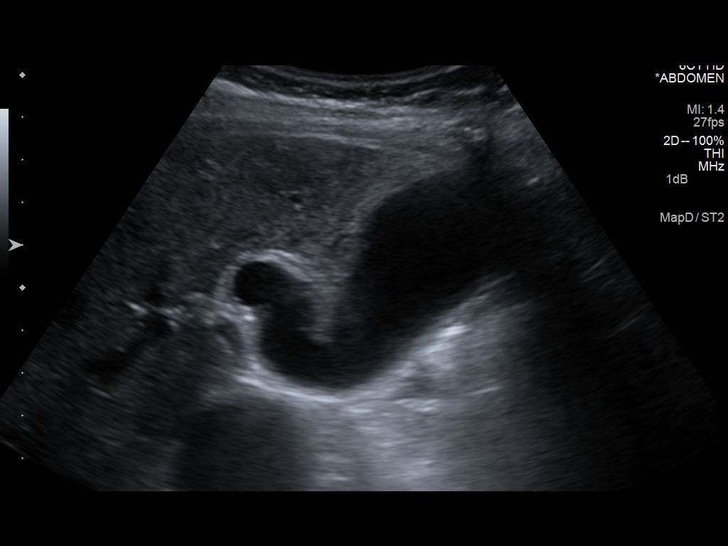
[im 13/37]
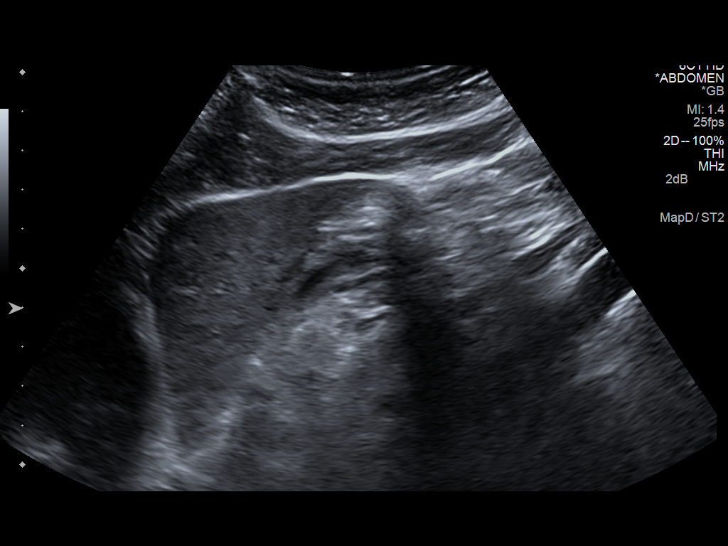
[im 14/37]
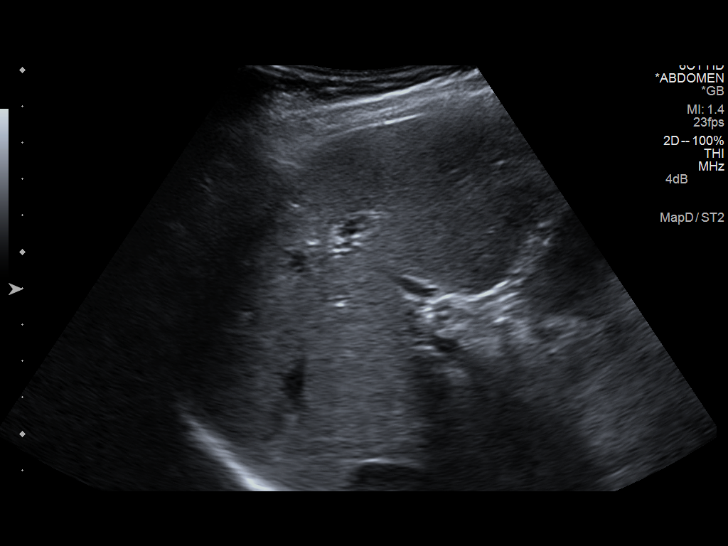
[im 17/37]
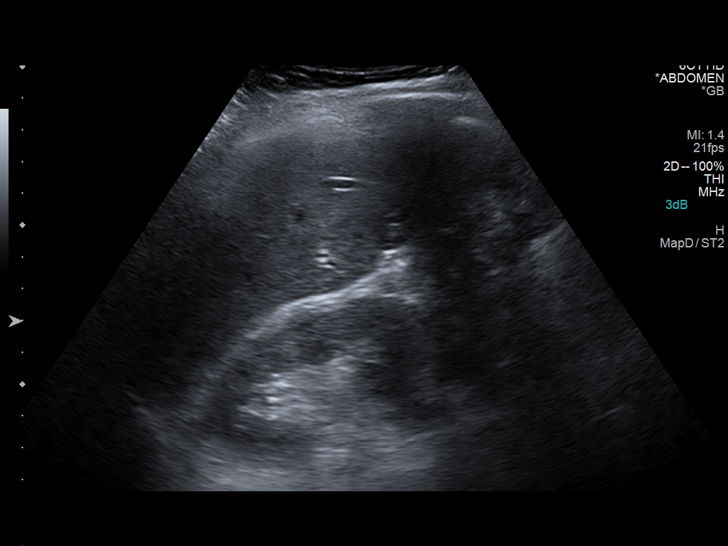
[im 20/37]
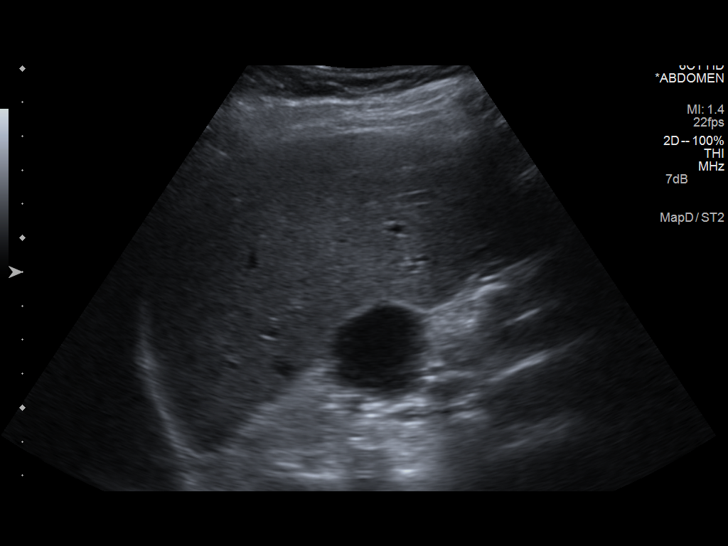
[im 23/37]
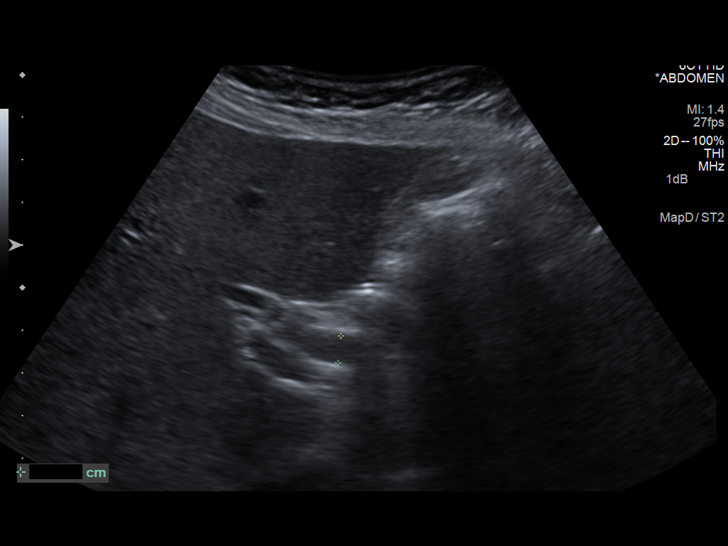
[im 25/37]
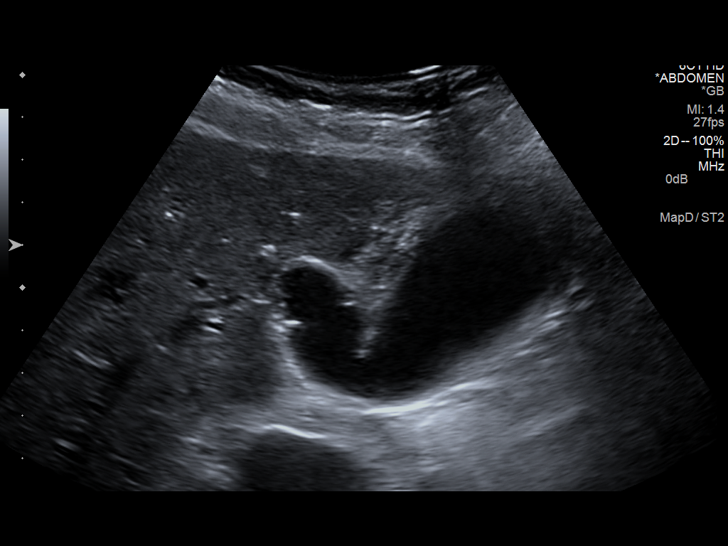
[im 28/37]
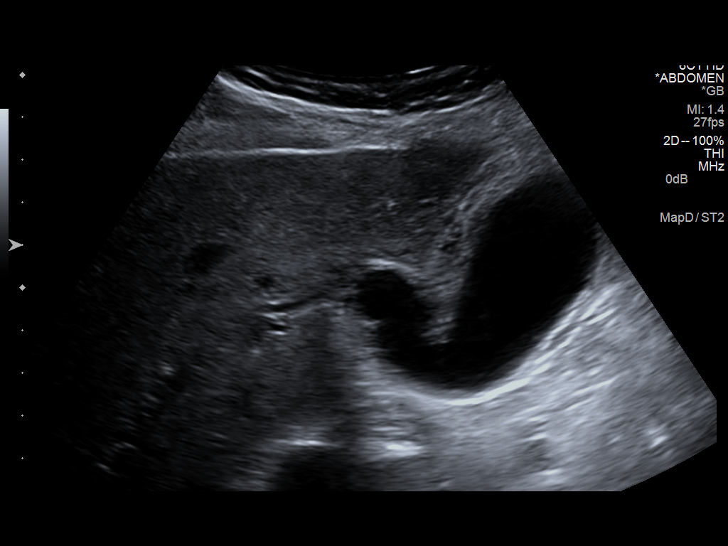
[im 31/37]
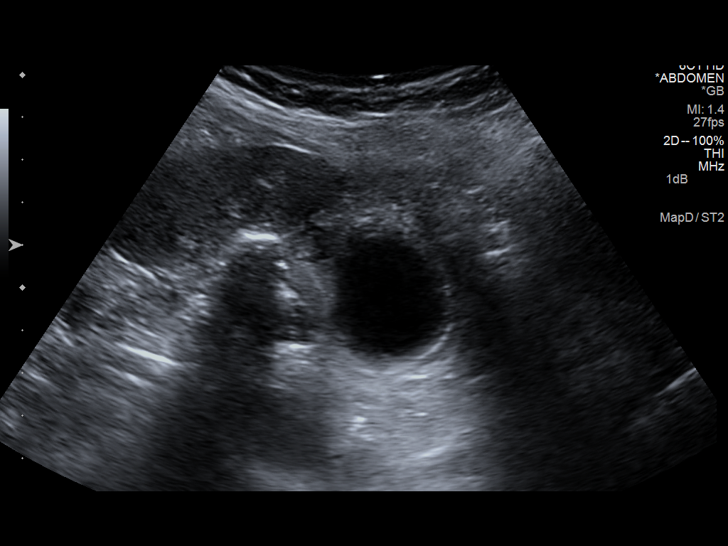
[im 34/37]
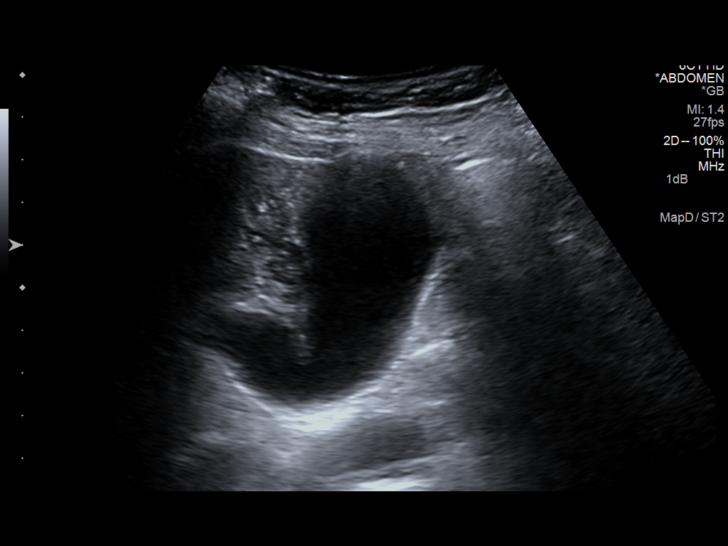
[im 37/37]
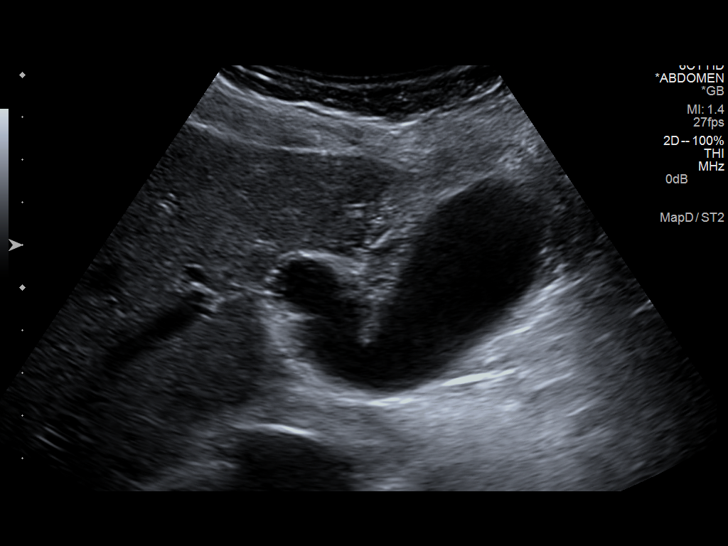

[14 of 25 positions shown; findings below may reference images not displayed]

FINDINGS: Gallbladder

No gallstones are seen, but there is a striated and thickened
gallbladder wall and focal gallbladder tenderness (per sonographic
exam).

Common bile duct

Diameter: 6-7 mm

Liver:

No focal lesion identified. Within normal limits in parenchymal
echogenicity.
IMPRESSION: Although no gallstones are seen, the gallbladder wall is thickened
and tender. Acute cholecystitis remains a diagnostic concern.

## 2015-04-19 DIAGNOSIS — M545 Low back pain: Secondary | ICD-10-CM | POA: Diagnosis not present

## 2015-04-19 DIAGNOSIS — G309 Alzheimer's disease, unspecified: Secondary | ICD-10-CM | POA: Diagnosis not present

## 2015-04-19 DIAGNOSIS — F39 Unspecified mood [affective] disorder: Secondary | ICD-10-CM | POA: Diagnosis not present

## 2015-05-05 DIAGNOSIS — R112 Nausea with vomiting, unspecified: Secondary | ICD-10-CM | POA: Diagnosis not present

## 2015-05-05 DIAGNOSIS — R1013 Epigastric pain: Secondary | ICD-10-CM | POA: Diagnosis not present

## 2015-05-10 DIAGNOSIS — R1110 Vomiting, unspecified: Secondary | ICD-10-CM | POA: Diagnosis not present

## 2015-05-10 DIAGNOSIS — R1013 Epigastric pain: Secondary | ICD-10-CM | POA: Diagnosis not present

## 2015-06-14 DIAGNOSIS — M545 Low back pain: Secondary | ICD-10-CM

## 2015-06-14 DIAGNOSIS — G309 Alzheimer's disease, unspecified: Secondary | ICD-10-CM | POA: Diagnosis not present

## 2015-06-14 DIAGNOSIS — F39 Unspecified mood [affective] disorder: Secondary | ICD-10-CM | POA: Diagnosis not present

## 2015-08-23 DIAGNOSIS — M545 Low back pain: Secondary | ICD-10-CM | POA: Diagnosis not present

## 2015-08-23 DIAGNOSIS — G309 Alzheimer's disease, unspecified: Secondary | ICD-10-CM | POA: Diagnosis not present

## 2015-08-23 DIAGNOSIS — F39 Unspecified mood [affective] disorder: Secondary | ICD-10-CM | POA: Diagnosis not present

## 2015-08-23 DIAGNOSIS — K219 Gastro-esophageal reflux disease without esophagitis: Secondary | ICD-10-CM | POA: Diagnosis not present

## 2015-10-30 DIAGNOSIS — M6281 Muscle weakness (generalized): Secondary | ICD-10-CM | POA: Diagnosis not present

## 2015-10-30 DIAGNOSIS — R262 Difficulty in walking, not elsewhere classified: Secondary | ICD-10-CM | POA: Diagnosis not present

## 2015-10-31 DIAGNOSIS — R262 Difficulty in walking, not elsewhere classified: Secondary | ICD-10-CM | POA: Diagnosis not present

## 2015-10-31 DIAGNOSIS — M6281 Muscle weakness (generalized): Secondary | ICD-10-CM | POA: Diagnosis not present

## 2015-11-01 DIAGNOSIS — G309 Alzheimer's disease, unspecified: Secondary | ICD-10-CM | POA: Diagnosis not present

## 2015-11-01 DIAGNOSIS — F39 Unspecified mood [affective] disorder: Secondary | ICD-10-CM | POA: Diagnosis not present

## 2015-11-01 DIAGNOSIS — R262 Difficulty in walking, not elsewhere classified: Secondary | ICD-10-CM | POA: Diagnosis not present

## 2015-11-01 DIAGNOSIS — M6281 Muscle weakness (generalized): Secondary | ICD-10-CM | POA: Diagnosis not present

## 2015-11-01 DIAGNOSIS — M545 Low back pain: Secondary | ICD-10-CM | POA: Diagnosis not present

## 2015-11-01 DIAGNOSIS — K219 Gastro-esophageal reflux disease without esophagitis: Secondary | ICD-10-CM | POA: Diagnosis not present

## 2015-11-02 DIAGNOSIS — R262 Difficulty in walking, not elsewhere classified: Secondary | ICD-10-CM | POA: Diagnosis not present

## 2015-11-02 DIAGNOSIS — M6281 Muscle weakness (generalized): Secondary | ICD-10-CM | POA: Diagnosis not present

## 2015-11-03 DIAGNOSIS — R262 Difficulty in walking, not elsewhere classified: Secondary | ICD-10-CM | POA: Diagnosis not present

## 2015-11-03 DIAGNOSIS — M6281 Muscle weakness (generalized): Secondary | ICD-10-CM | POA: Diagnosis not present

## 2015-11-06 DIAGNOSIS — M6281 Muscle weakness (generalized): Secondary | ICD-10-CM | POA: Diagnosis not present

## 2015-11-06 DIAGNOSIS — R262 Difficulty in walking, not elsewhere classified: Secondary | ICD-10-CM | POA: Diagnosis not present

## 2015-11-07 DIAGNOSIS — R262 Difficulty in walking, not elsewhere classified: Secondary | ICD-10-CM | POA: Diagnosis not present

## 2015-11-07 DIAGNOSIS — M6281 Muscle weakness (generalized): Secondary | ICD-10-CM | POA: Diagnosis not present

## 2015-11-08 DIAGNOSIS — M6281 Muscle weakness (generalized): Secondary | ICD-10-CM | POA: Diagnosis not present

## 2015-11-08 DIAGNOSIS — R262 Difficulty in walking, not elsewhere classified: Secondary | ICD-10-CM | POA: Diagnosis not present

## 2015-11-09 DIAGNOSIS — R262 Difficulty in walking, not elsewhere classified: Secondary | ICD-10-CM | POA: Diagnosis not present

## 2015-11-09 DIAGNOSIS — M6281 Muscle weakness (generalized): Secondary | ICD-10-CM | POA: Diagnosis not present

## 2015-11-10 DIAGNOSIS — M6281 Muscle weakness (generalized): Secondary | ICD-10-CM | POA: Diagnosis not present

## 2015-11-10 DIAGNOSIS — R262 Difficulty in walking, not elsewhere classified: Secondary | ICD-10-CM | POA: Diagnosis not present

## 2015-11-13 DIAGNOSIS — M25512 Pain in left shoulder: Secondary | ICD-10-CM | POA: Diagnosis not present

## 2015-11-13 DIAGNOSIS — M25511 Pain in right shoulder: Secondary | ICD-10-CM | POA: Diagnosis not present

## 2015-11-13 DIAGNOSIS — R262 Difficulty in walking, not elsewhere classified: Secondary | ICD-10-CM | POA: Diagnosis not present

## 2015-11-13 DIAGNOSIS — M6281 Muscle weakness (generalized): Secondary | ICD-10-CM | POA: Diagnosis not present

## 2015-11-14 DIAGNOSIS — M6281 Muscle weakness (generalized): Secondary | ICD-10-CM | POA: Diagnosis not present

## 2015-11-14 DIAGNOSIS — M25512 Pain in left shoulder: Secondary | ICD-10-CM | POA: Diagnosis not present

## 2015-11-14 DIAGNOSIS — M25511 Pain in right shoulder: Secondary | ICD-10-CM | POA: Diagnosis not present

## 2015-11-14 DIAGNOSIS — R262 Difficulty in walking, not elsewhere classified: Secondary | ICD-10-CM | POA: Diagnosis not present

## 2015-11-15 DIAGNOSIS — M25512 Pain in left shoulder: Secondary | ICD-10-CM | POA: Diagnosis not present

## 2015-11-15 DIAGNOSIS — M6281 Muscle weakness (generalized): Secondary | ICD-10-CM | POA: Diagnosis not present

## 2015-11-15 DIAGNOSIS — R262 Difficulty in walking, not elsewhere classified: Secondary | ICD-10-CM | POA: Diagnosis not present

## 2015-11-15 DIAGNOSIS — M25511 Pain in right shoulder: Secondary | ICD-10-CM | POA: Diagnosis not present

## 2015-11-16 DIAGNOSIS — R262 Difficulty in walking, not elsewhere classified: Secondary | ICD-10-CM | POA: Diagnosis not present

## 2015-11-16 DIAGNOSIS — M25511 Pain in right shoulder: Secondary | ICD-10-CM | POA: Diagnosis not present

## 2015-11-16 DIAGNOSIS — M6281 Muscle weakness (generalized): Secondary | ICD-10-CM | POA: Diagnosis not present

## 2015-11-16 DIAGNOSIS — M25512 Pain in left shoulder: Secondary | ICD-10-CM | POA: Diagnosis not present

## 2015-11-17 DIAGNOSIS — R262 Difficulty in walking, not elsewhere classified: Secondary | ICD-10-CM | POA: Diagnosis not present

## 2015-11-17 DIAGNOSIS — M6281 Muscle weakness (generalized): Secondary | ICD-10-CM | POA: Diagnosis not present

## 2015-11-17 DIAGNOSIS — M25511 Pain in right shoulder: Secondary | ICD-10-CM | POA: Diagnosis not present

## 2015-11-17 DIAGNOSIS — M25512 Pain in left shoulder: Secondary | ICD-10-CM | POA: Diagnosis not present

## 2015-11-20 DIAGNOSIS — R262 Difficulty in walking, not elsewhere classified: Secondary | ICD-10-CM | POA: Diagnosis not present

## 2015-11-20 DIAGNOSIS — M25511 Pain in right shoulder: Secondary | ICD-10-CM | POA: Diagnosis not present

## 2015-11-20 DIAGNOSIS — M25512 Pain in left shoulder: Secondary | ICD-10-CM | POA: Diagnosis not present

## 2015-11-20 DIAGNOSIS — M6281 Muscle weakness (generalized): Secondary | ICD-10-CM | POA: Diagnosis not present

## 2015-11-21 DIAGNOSIS — M25511 Pain in right shoulder: Secondary | ICD-10-CM | POA: Diagnosis not present

## 2015-11-21 DIAGNOSIS — R262 Difficulty in walking, not elsewhere classified: Secondary | ICD-10-CM | POA: Diagnosis not present

## 2015-11-21 DIAGNOSIS — M6281 Muscle weakness (generalized): Secondary | ICD-10-CM | POA: Diagnosis not present

## 2015-11-21 DIAGNOSIS — M25512 Pain in left shoulder: Secondary | ICD-10-CM | POA: Diagnosis not present

## 2015-11-22 DIAGNOSIS — M25512 Pain in left shoulder: Secondary | ICD-10-CM | POA: Diagnosis not present

## 2015-11-22 DIAGNOSIS — R262 Difficulty in walking, not elsewhere classified: Secondary | ICD-10-CM | POA: Diagnosis not present

## 2015-11-22 DIAGNOSIS — M6281 Muscle weakness (generalized): Secondary | ICD-10-CM | POA: Diagnosis not present

## 2015-11-22 DIAGNOSIS — M25511 Pain in right shoulder: Secondary | ICD-10-CM | POA: Diagnosis not present

## 2015-11-23 DIAGNOSIS — M25511 Pain in right shoulder: Secondary | ICD-10-CM | POA: Diagnosis not present

## 2015-11-23 DIAGNOSIS — M25512 Pain in left shoulder: Secondary | ICD-10-CM | POA: Diagnosis not present

## 2015-11-23 DIAGNOSIS — M6281 Muscle weakness (generalized): Secondary | ICD-10-CM | POA: Diagnosis not present

## 2015-11-23 DIAGNOSIS — R262 Difficulty in walking, not elsewhere classified: Secondary | ICD-10-CM | POA: Diagnosis not present

## 2015-11-24 DIAGNOSIS — R262 Difficulty in walking, not elsewhere classified: Secondary | ICD-10-CM | POA: Diagnosis not present

## 2015-11-24 DIAGNOSIS — M6281 Muscle weakness (generalized): Secondary | ICD-10-CM | POA: Diagnosis not present

## 2015-11-24 DIAGNOSIS — M25511 Pain in right shoulder: Secondary | ICD-10-CM | POA: Diagnosis not present

## 2015-11-24 DIAGNOSIS — M25512 Pain in left shoulder: Secondary | ICD-10-CM | POA: Diagnosis not present

## 2015-11-27 DIAGNOSIS — R262 Difficulty in walking, not elsewhere classified: Secondary | ICD-10-CM | POA: Diagnosis not present

## 2015-11-27 DIAGNOSIS — M6281 Muscle weakness (generalized): Secondary | ICD-10-CM | POA: Diagnosis not present

## 2015-11-27 DIAGNOSIS — M25511 Pain in right shoulder: Secondary | ICD-10-CM | POA: Diagnosis not present

## 2015-11-27 DIAGNOSIS — M25512 Pain in left shoulder: Secondary | ICD-10-CM | POA: Diagnosis not present

## 2015-11-28 DIAGNOSIS — M6281 Muscle weakness (generalized): Secondary | ICD-10-CM | POA: Diagnosis not present

## 2015-11-28 DIAGNOSIS — R262 Difficulty in walking, not elsewhere classified: Secondary | ICD-10-CM | POA: Diagnosis not present

## 2015-11-28 DIAGNOSIS — M25511 Pain in right shoulder: Secondary | ICD-10-CM | POA: Diagnosis not present

## 2015-11-28 DIAGNOSIS — M25512 Pain in left shoulder: Secondary | ICD-10-CM | POA: Diagnosis not present

## 2015-11-29 DIAGNOSIS — M6281 Muscle weakness (generalized): Secondary | ICD-10-CM | POA: Diagnosis not present

## 2015-11-29 DIAGNOSIS — R262 Difficulty in walking, not elsewhere classified: Secondary | ICD-10-CM | POA: Diagnosis not present

## 2015-11-29 DIAGNOSIS — M25512 Pain in left shoulder: Secondary | ICD-10-CM | POA: Diagnosis not present

## 2015-11-29 DIAGNOSIS — M25511 Pain in right shoulder: Secondary | ICD-10-CM | POA: Diagnosis not present

## 2015-11-30 DIAGNOSIS — R262 Difficulty in walking, not elsewhere classified: Secondary | ICD-10-CM | POA: Diagnosis not present

## 2015-11-30 DIAGNOSIS — M6281 Muscle weakness (generalized): Secondary | ICD-10-CM | POA: Diagnosis not present

## 2015-11-30 DIAGNOSIS — M25511 Pain in right shoulder: Secondary | ICD-10-CM | POA: Diagnosis not present

## 2015-11-30 DIAGNOSIS — M25512 Pain in left shoulder: Secondary | ICD-10-CM | POA: Diagnosis not present

## 2015-12-01 DIAGNOSIS — M6281 Muscle weakness (generalized): Secondary | ICD-10-CM | POA: Diagnosis not present

## 2015-12-01 DIAGNOSIS — M25512 Pain in left shoulder: Secondary | ICD-10-CM | POA: Diagnosis not present

## 2015-12-01 DIAGNOSIS — M25511 Pain in right shoulder: Secondary | ICD-10-CM | POA: Diagnosis not present

## 2015-12-01 DIAGNOSIS — R262 Difficulty in walking, not elsewhere classified: Secondary | ICD-10-CM | POA: Diagnosis not present

## 2015-12-04 DIAGNOSIS — M6281 Muscle weakness (generalized): Secondary | ICD-10-CM | POA: Diagnosis not present

## 2015-12-04 DIAGNOSIS — R262 Difficulty in walking, not elsewhere classified: Secondary | ICD-10-CM | POA: Diagnosis not present

## 2015-12-04 DIAGNOSIS — M25512 Pain in left shoulder: Secondary | ICD-10-CM | POA: Diagnosis not present

## 2015-12-04 DIAGNOSIS — M25511 Pain in right shoulder: Secondary | ICD-10-CM | POA: Diagnosis not present

## 2015-12-05 DIAGNOSIS — M25511 Pain in right shoulder: Secondary | ICD-10-CM | POA: Diagnosis not present

## 2015-12-05 DIAGNOSIS — M25512 Pain in left shoulder: Secondary | ICD-10-CM | POA: Diagnosis not present

## 2015-12-05 DIAGNOSIS — M6281 Muscle weakness (generalized): Secondary | ICD-10-CM | POA: Diagnosis not present

## 2015-12-05 DIAGNOSIS — R262 Difficulty in walking, not elsewhere classified: Secondary | ICD-10-CM | POA: Diagnosis not present

## 2015-12-08 DIAGNOSIS — R262 Difficulty in walking, not elsewhere classified: Secondary | ICD-10-CM | POA: Diagnosis not present

## 2015-12-08 DIAGNOSIS — M25512 Pain in left shoulder: Secondary | ICD-10-CM | POA: Diagnosis not present

## 2015-12-08 DIAGNOSIS — M25511 Pain in right shoulder: Secondary | ICD-10-CM | POA: Diagnosis not present

## 2015-12-08 DIAGNOSIS — M6281 Muscle weakness (generalized): Secondary | ICD-10-CM | POA: Diagnosis not present

## 2015-12-20 DIAGNOSIS — I10 Essential (primary) hypertension: Secondary | ICD-10-CM | POA: Diagnosis not present

## 2015-12-20 DIAGNOSIS — K219 Gastro-esophageal reflux disease without esophagitis: Secondary | ICD-10-CM | POA: Diagnosis not present

## 2015-12-20 DIAGNOSIS — M545 Low back pain: Secondary | ICD-10-CM | POA: Diagnosis not present

## 2015-12-20 DIAGNOSIS — F39 Unspecified mood [affective] disorder: Secondary | ICD-10-CM

## 2015-12-20 DIAGNOSIS — G309 Alzheimer's disease, unspecified: Secondary | ICD-10-CM | POA: Diagnosis not present

## 2016-02-23 DIAGNOSIS — K219 Gastro-esophageal reflux disease without esophagitis: Secondary | ICD-10-CM | POA: Diagnosis not present

## 2016-02-23 DIAGNOSIS — M159 Polyosteoarthritis, unspecified: Secondary | ICD-10-CM | POA: Diagnosis not present

## 2016-02-23 DIAGNOSIS — G301 Alzheimer's disease with late onset: Secondary | ICD-10-CM | POA: Diagnosis not present

## 2016-02-23 DIAGNOSIS — F39 Unspecified mood [affective] disorder: Secondary | ICD-10-CM | POA: Diagnosis not present

## 2016-03-20 DIAGNOSIS — F39 Unspecified mood [affective] disorder: Secondary | ICD-10-CM

## 2016-03-20 DIAGNOSIS — K219 Gastro-esophageal reflux disease without esophagitis: Secondary | ICD-10-CM

## 2016-03-20 DIAGNOSIS — G309 Alzheimer's disease, unspecified: Secondary | ICD-10-CM

## 2016-03-20 DIAGNOSIS — M545 Low back pain: Secondary | ICD-10-CM

## 2016-05-03 DIAGNOSIS — G301 Alzheimer's disease with late onset: Secondary | ICD-10-CM | POA: Diagnosis not present

## 2016-05-03 DIAGNOSIS — M159 Polyosteoarthritis, unspecified: Secondary | ICD-10-CM | POA: Diagnosis not present

## 2016-05-03 DIAGNOSIS — K219 Gastro-esophageal reflux disease without esophagitis: Secondary | ICD-10-CM | POA: Diagnosis not present

## 2016-05-03 DIAGNOSIS — F39 Unspecified mood [affective] disorder: Secondary | ICD-10-CM | POA: Diagnosis not present

## 2016-05-17 DIAGNOSIS — G309 Alzheimer's disease, unspecified: Secondary | ICD-10-CM

## 2016-05-17 DIAGNOSIS — K219 Gastro-esophageal reflux disease without esophagitis: Secondary | ICD-10-CM

## 2016-05-17 DIAGNOSIS — F39 Unspecified mood [affective] disorder: Secondary | ICD-10-CM

## 2016-05-17 DIAGNOSIS — M1990 Unspecified osteoarthritis, unspecified site: Secondary | ICD-10-CM

## 2016-08-02 DIAGNOSIS — F39 Unspecified mood [affective] disorder: Secondary | ICD-10-CM | POA: Diagnosis not present

## 2016-08-02 DIAGNOSIS — M159 Polyosteoarthritis, unspecified: Secondary | ICD-10-CM | POA: Diagnosis not present

## 2016-08-02 DIAGNOSIS — G301 Alzheimer's disease with late onset: Secondary | ICD-10-CM | POA: Diagnosis not present

## 2016-08-02 DIAGNOSIS — K219 Gastro-esophageal reflux disease without esophagitis: Secondary | ICD-10-CM | POA: Diagnosis not present

## 2016-09-27 DIAGNOSIS — L02214 Cutaneous abscess of groin: Secondary | ICD-10-CM | POA: Diagnosis not present

## 2016-10-11 DIAGNOSIS — L02214 Cutaneous abscess of groin: Secondary | ICD-10-CM | POA: Diagnosis not present

## 2016-11-15 DIAGNOSIS — F39 Unspecified mood [affective] disorder: Secondary | ICD-10-CM | POA: Diagnosis not present

## 2016-11-15 DIAGNOSIS — K219 Gastro-esophageal reflux disease without esophagitis: Secondary | ICD-10-CM | POA: Diagnosis not present

## 2016-11-15 DIAGNOSIS — M159 Polyosteoarthritis, unspecified: Secondary | ICD-10-CM | POA: Diagnosis not present

## 2016-11-15 DIAGNOSIS — G301 Alzheimer's disease with late onset: Secondary | ICD-10-CM | POA: Diagnosis not present

## 2017-01-15 DIAGNOSIS — K219 Gastro-esophageal reflux disease without esophagitis: Secondary | ICD-10-CM | POA: Diagnosis not present

## 2017-01-15 DIAGNOSIS — F39 Unspecified mood [affective] disorder: Secondary | ICD-10-CM | POA: Diagnosis not present

## 2017-01-15 DIAGNOSIS — G309 Alzheimer's disease, unspecified: Secondary | ICD-10-CM | POA: Diagnosis not present

## 2017-01-15 DIAGNOSIS — M1990 Unspecified osteoarthritis, unspecified site: Secondary | ICD-10-CM | POA: Diagnosis not present

## 2017-04-07 DIAGNOSIS — E441 Mild protein-calorie malnutrition: Secondary | ICD-10-CM | POA: Diagnosis not present

## 2017-04-07 DIAGNOSIS — F39 Unspecified mood [affective] disorder: Secondary | ICD-10-CM | POA: Diagnosis not present

## 2017-04-07 DIAGNOSIS — M159 Polyosteoarthritis, unspecified: Secondary | ICD-10-CM | POA: Diagnosis not present

## 2017-04-07 DIAGNOSIS — G301 Alzheimer's disease with late onset: Secondary | ICD-10-CM | POA: Diagnosis not present

## 2017-05-14 DIAGNOSIS — M1990 Unspecified osteoarthritis, unspecified site: Secondary | ICD-10-CM | POA: Diagnosis not present

## 2017-05-14 DIAGNOSIS — F39 Unspecified mood [affective] disorder: Secondary | ICD-10-CM | POA: Diagnosis not present

## 2017-05-14 DIAGNOSIS — K219 Gastro-esophageal reflux disease without esophagitis: Secondary | ICD-10-CM | POA: Diagnosis not present

## 2017-05-14 DIAGNOSIS — G309 Alzheimer's disease, unspecified: Secondary | ICD-10-CM | POA: Diagnosis not present

## 2017-05-14 DIAGNOSIS — E43 Unspecified severe protein-calorie malnutrition: Secondary | ICD-10-CM | POA: Diagnosis not present

## 2017-07-23 DIAGNOSIS — E441 Mild protein-calorie malnutrition: Secondary | ICD-10-CM | POA: Diagnosis not present

## 2017-07-23 DIAGNOSIS — G301 Alzheimer's disease with late onset: Secondary | ICD-10-CM | POA: Diagnosis not present

## 2017-07-23 DIAGNOSIS — F39 Unspecified mood [affective] disorder: Secondary | ICD-10-CM | POA: Diagnosis not present

## 2017-07-23 DIAGNOSIS — M159 Polyosteoarthritis, unspecified: Secondary | ICD-10-CM | POA: Diagnosis not present

## 2017-09-10 DIAGNOSIS — F39 Unspecified mood [affective] disorder: Secondary | ICD-10-CM | POA: Diagnosis not present

## 2017-09-10 DIAGNOSIS — E43 Unspecified severe protein-calorie malnutrition: Secondary | ICD-10-CM | POA: Diagnosis not present

## 2017-09-10 DIAGNOSIS — M1990 Unspecified osteoarthritis, unspecified site: Secondary | ICD-10-CM | POA: Diagnosis not present

## 2017-09-10 DIAGNOSIS — G309 Alzheimer's disease, unspecified: Secondary | ICD-10-CM | POA: Diagnosis not present

## 2017-09-25 DIAGNOSIS — L98411 Non-pressure chronic ulcer of buttock limited to breakdown of skin: Secondary | ICD-10-CM | POA: Diagnosis not present

## 2017-11-12 DIAGNOSIS — G301 Alzheimer's disease with late onset: Secondary | ICD-10-CM | POA: Diagnosis not present

## 2017-11-12 DIAGNOSIS — F39 Unspecified mood [affective] disorder: Secondary | ICD-10-CM | POA: Diagnosis not present

## 2017-11-12 DIAGNOSIS — M159 Polyosteoarthritis, unspecified: Secondary | ICD-10-CM | POA: Diagnosis not present

## 2017-12-12 DIAGNOSIS — B351 Tinea unguium: Secondary | ICD-10-CM | POA: Diagnosis not present

## 2018-01-21 DIAGNOSIS — F39 Unspecified mood [affective] disorder: Secondary | ICD-10-CM | POA: Diagnosis not present

## 2018-01-21 DIAGNOSIS — G309 Alzheimer's disease, unspecified: Secondary | ICD-10-CM | POA: Diagnosis not present

## 2018-01-21 DIAGNOSIS — M1990 Unspecified osteoarthritis, unspecified site: Secondary | ICD-10-CM | POA: Diagnosis not present

## 2018-03-26 DIAGNOSIS — F39 Unspecified mood [affective] disorder: Secondary | ICD-10-CM | POA: Diagnosis not present

## 2018-03-26 DIAGNOSIS — M159 Polyosteoarthritis, unspecified: Secondary | ICD-10-CM | POA: Diagnosis not present

## 2018-03-26 DIAGNOSIS — G301 Alzheimer's disease with late onset: Secondary | ICD-10-CM | POA: Diagnosis not present

## 2018-05-08 DIAGNOSIS — L723 Sebaceous cyst: Secondary | ICD-10-CM | POA: Diagnosis not present

## 2018-05-22 DIAGNOSIS — G309 Alzheimer's disease, unspecified: Secondary | ICD-10-CM | POA: Diagnosis not present

## 2018-05-22 DIAGNOSIS — M1990 Unspecified osteoarthritis, unspecified site: Secondary | ICD-10-CM | POA: Diagnosis not present

## 2018-05-22 DIAGNOSIS — F39 Unspecified mood [affective] disorder: Secondary | ICD-10-CM | POA: Diagnosis not present

## 2018-07-03 DIAGNOSIS — L720 Epidermal cyst: Secondary | ICD-10-CM | POA: Diagnosis not present

## 2018-07-13 DIAGNOSIS — H60322 Hemorrhagic otitis externa, left ear: Secondary | ICD-10-CM | POA: Diagnosis not present

## 2018-07-23 DIAGNOSIS — G301 Alzheimer's disease with late onset: Secondary | ICD-10-CM | POA: Diagnosis not present

## 2018-07-23 DIAGNOSIS — F39 Unspecified mood [affective] disorder: Secondary | ICD-10-CM | POA: Diagnosis not present

## 2018-07-23 DIAGNOSIS — E441 Mild protein-calorie malnutrition: Secondary | ICD-10-CM | POA: Diagnosis not present

## 2018-07-23 DIAGNOSIS — M159 Polyosteoarthritis, unspecified: Secondary | ICD-10-CM | POA: Diagnosis not present

## 2018-09-23 DIAGNOSIS — I872 Venous insufficiency (chronic) (peripheral): Secondary | ICD-10-CM | POA: Diagnosis not present

## 2018-09-23 DIAGNOSIS — G309 Alzheimer's disease, unspecified: Secondary | ICD-10-CM

## 2018-09-23 DIAGNOSIS — F39 Unspecified mood [affective] disorder: Secondary | ICD-10-CM

## 2018-09-23 DIAGNOSIS — I10 Essential (primary) hypertension: Secondary | ICD-10-CM | POA: Diagnosis not present

## 2018-09-23 DIAGNOSIS — I350 Nonrheumatic aortic (valve) stenosis: Secondary | ICD-10-CM

## 2018-11-26 DIAGNOSIS — G301 Alzheimer's disease with late onset: Secondary | ICD-10-CM

## 2018-11-26 DIAGNOSIS — M159 Polyosteoarthritis, unspecified: Secondary | ICD-10-CM | POA: Diagnosis not present

## 2018-11-26 DIAGNOSIS — F39 Unspecified mood [affective] disorder: Secondary | ICD-10-CM | POA: Diagnosis not present

## 2018-11-26 DIAGNOSIS — E441 Mild protein-calorie malnutrition: Secondary | ICD-10-CM

## 2019-01-15 DIAGNOSIS — G309 Alzheimer's disease, unspecified: Secondary | ICD-10-CM | POA: Diagnosis not present

## 2019-01-15 DIAGNOSIS — M1990 Unspecified osteoarthritis, unspecified site: Secondary | ICD-10-CM | POA: Diagnosis not present

## 2019-01-15 DIAGNOSIS — F39 Unspecified mood [affective] disorder: Secondary | ICD-10-CM | POA: Diagnosis not present

## 2019-01-15 DIAGNOSIS — E43 Unspecified severe protein-calorie malnutrition: Secondary | ICD-10-CM | POA: Diagnosis not present

## 2019-03-23 DIAGNOSIS — F39 Unspecified mood [affective] disorder: Secondary | ICD-10-CM | POA: Diagnosis not present

## 2019-03-23 DIAGNOSIS — M159 Polyosteoarthritis, unspecified: Secondary | ICD-10-CM | POA: Diagnosis not present

## 2019-03-23 DIAGNOSIS — G301 Alzheimer's disease with late onset: Secondary | ICD-10-CM

## 2019-03-23 DIAGNOSIS — E441 Mild protein-calorie malnutrition: Secondary | ICD-10-CM | POA: Diagnosis not present

## 2019-04-23 DIAGNOSIS — M26602 Left temporomandibular joint disorder, unspecified: Secondary | ICD-10-CM

## 2019-04-23 DIAGNOSIS — A09 Infectious gastroenteritis and colitis, unspecified: Secondary | ICD-10-CM | POA: Diagnosis not present

## 2019-05-03 DIAGNOSIS — L401 Generalized pustular psoriasis: Secondary | ICD-10-CM | POA: Diagnosis not present

## 2019-05-21 DIAGNOSIS — G309 Alzheimer's disease, unspecified: Secondary | ICD-10-CM | POA: Diagnosis not present

## 2019-05-21 DIAGNOSIS — F39 Unspecified mood [affective] disorder: Secondary | ICD-10-CM

## 2019-05-21 DIAGNOSIS — M1990 Unspecified osteoarthritis, unspecified site: Secondary | ICD-10-CM | POA: Diagnosis not present

## 2019-05-21 DIAGNOSIS — E43 Unspecified severe protein-calorie malnutrition: Secondary | ICD-10-CM | POA: Diagnosis not present

## 2019-07-28 DIAGNOSIS — F39 Unspecified mood [affective] disorder: Secondary | ICD-10-CM | POA: Diagnosis not present

## 2019-07-28 DIAGNOSIS — M159 Polyosteoarthritis, unspecified: Secondary | ICD-10-CM | POA: Diagnosis not present

## 2019-07-28 DIAGNOSIS — G301 Alzheimer's disease with late onset: Secondary | ICD-10-CM | POA: Diagnosis not present

## 2019-09-15 DIAGNOSIS — G309 Alzheimer's disease, unspecified: Secondary | ICD-10-CM

## 2019-09-15 DIAGNOSIS — M1990 Unspecified osteoarthritis, unspecified site: Secondary | ICD-10-CM

## 2019-09-15 DIAGNOSIS — F39 Unspecified mood [affective] disorder: Secondary | ICD-10-CM

## 2019-09-15 DIAGNOSIS — E43 Unspecified severe protein-calorie malnutrition: Secondary | ICD-10-CM

## 2019-11-23 DIAGNOSIS — F39 Unspecified mood [affective] disorder: Secondary | ICD-10-CM

## 2019-11-23 DIAGNOSIS — G301 Alzheimer's disease with late onset: Secondary | ICD-10-CM

## 2019-11-23 DIAGNOSIS — M159 Polyosteoarthritis, unspecified: Secondary | ICD-10-CM

## 2020-01-28 DIAGNOSIS — F39 Unspecified mood [affective] disorder: Secondary | ICD-10-CM | POA: Diagnosis not present

## 2020-01-28 DIAGNOSIS — E43 Unspecified severe protein-calorie malnutrition: Secondary | ICD-10-CM | POA: Diagnosis not present

## 2020-01-28 DIAGNOSIS — G309 Alzheimer's disease, unspecified: Secondary | ICD-10-CM | POA: Diagnosis not present

## 2020-01-28 DIAGNOSIS — M1990 Unspecified osteoarthritis, unspecified site: Secondary | ICD-10-CM | POA: Diagnosis not present

## 2020-04-03 DIAGNOSIS — F39 Unspecified mood [affective] disorder: Secondary | ICD-10-CM

## 2020-04-03 DIAGNOSIS — K5900 Constipation, unspecified: Secondary | ICD-10-CM

## 2020-04-03 DIAGNOSIS — G301 Alzheimer's disease with late onset: Secondary | ICD-10-CM

## 2020-04-03 DIAGNOSIS — M159 Polyosteoarthritis, unspecified: Secondary | ICD-10-CM

## 2020-04-14 DIAGNOSIS — G309 Alzheimer's disease, unspecified: Secondary | ICD-10-CM | POA: Diagnosis not present

## 2020-04-14 DIAGNOSIS — K5900 Constipation, unspecified: Secondary | ICD-10-CM | POA: Diagnosis not present

## 2020-04-14 DIAGNOSIS — M1990 Unspecified osteoarthritis, unspecified site: Secondary | ICD-10-CM | POA: Diagnosis not present

## 2020-04-14 DIAGNOSIS — F39 Unspecified mood [affective] disorder: Secondary | ICD-10-CM | POA: Diagnosis not present

## 2020-04-14 DIAGNOSIS — B351 Tinea unguium: Secondary | ICD-10-CM

## 2020-07-10 DIAGNOSIS — G301 Alzheimer's disease with late onset: Secondary | ICD-10-CM | POA: Diagnosis not present

## 2020-07-10 DIAGNOSIS — F39 Unspecified mood [affective] disorder: Secondary | ICD-10-CM | POA: Diagnosis not present

## 2020-07-10 DIAGNOSIS — M159 Polyosteoarthritis, unspecified: Secondary | ICD-10-CM | POA: Diagnosis not present

## 2020-09-06 DIAGNOSIS — G309 Alzheimer's disease, unspecified: Secondary | ICD-10-CM | POA: Diagnosis not present

## 2020-09-06 DIAGNOSIS — M1990 Unspecified osteoarthritis, unspecified site: Secondary | ICD-10-CM | POA: Diagnosis not present

## 2020-09-06 DIAGNOSIS — F39 Unspecified mood [affective] disorder: Secondary | ICD-10-CM | POA: Diagnosis not present

## 2020-09-06 DIAGNOSIS — K5900 Constipation, unspecified: Secondary | ICD-10-CM | POA: Diagnosis not present

## 2020-11-06 DIAGNOSIS — F39 Unspecified mood [affective] disorder: Secondary | ICD-10-CM | POA: Diagnosis not present

## 2020-11-06 DIAGNOSIS — G301 Alzheimer's disease with late onset: Secondary | ICD-10-CM | POA: Diagnosis not present

## 2020-11-06 DIAGNOSIS — F0150 Vascular dementia without behavioral disturbance: Secondary | ICD-10-CM

## 2020-11-06 DIAGNOSIS — M159 Polyosteoarthritis, unspecified: Secondary | ICD-10-CM | POA: Diagnosis not present

## 2020-12-08 DIAGNOSIS — L02212 Cutaneous abscess of back [any part, except buttock]: Secondary | ICD-10-CM | POA: Diagnosis not present

## 2020-12-08 DIAGNOSIS — L723 Sebaceous cyst: Secondary | ICD-10-CM | POA: Diagnosis not present

## 2021-01-03 DIAGNOSIS — M1990 Unspecified osteoarthritis, unspecified site: Secondary | ICD-10-CM | POA: Diagnosis not present

## 2021-01-03 DIAGNOSIS — K5900 Constipation, unspecified: Secondary | ICD-10-CM | POA: Diagnosis not present

## 2021-01-03 DIAGNOSIS — G309 Alzheimer's disease, unspecified: Secondary | ICD-10-CM | POA: Diagnosis not present

## 2021-01-03 DIAGNOSIS — F39 Unspecified mood [affective] disorder: Secondary | ICD-10-CM | POA: Diagnosis not present

## 2021-02-28 DIAGNOSIS — M25552 Pain in left hip: Secondary | ICD-10-CM | POA: Diagnosis not present

## 2021-03-06 DIAGNOSIS — M159 Polyosteoarthritis, unspecified: Secondary | ICD-10-CM | POA: Diagnosis not present

## 2021-03-06 DIAGNOSIS — F0150 Vascular dementia without behavioral disturbance: Secondary | ICD-10-CM | POA: Diagnosis not present

## 2021-03-06 DIAGNOSIS — F39 Unspecified mood [affective] disorder: Secondary | ICD-10-CM | POA: Diagnosis not present

## 2021-05-02 DIAGNOSIS — M1990 Unspecified osteoarthritis, unspecified site: Secondary | ICD-10-CM | POA: Diagnosis not present

## 2021-05-02 DIAGNOSIS — F39 Unspecified mood [affective] disorder: Secondary | ICD-10-CM | POA: Diagnosis not present

## 2021-05-02 DIAGNOSIS — F0150 Vascular dementia without behavioral disturbance: Secondary | ICD-10-CM | POA: Diagnosis not present

## 2021-05-12 ENCOUNTER — Emergency Department: Payer: Medicare Other

## 2021-05-12 ENCOUNTER — Other Ambulatory Visit: Payer: Self-pay

## 2021-05-12 ENCOUNTER — Encounter: Payer: Self-pay | Admitting: Emergency Medicine

## 2021-05-12 ENCOUNTER — Emergency Department
Admission: EM | Admit: 2021-05-12 | Discharge: 2021-05-12 | Disposition: A | Discharge status: Home / Self Care | Payer: Medicare Other | Attending: Emergency Medicine | Admitting: Emergency Medicine

## 2021-05-12 DIAGNOSIS — Z79899 Other long term (current) drug therapy: Secondary | ICD-10-CM | POA: Insufficient documentation

## 2021-05-12 DIAGNOSIS — W050XXA Fall from non-moving wheelchair, initial encounter: Secondary | ICD-10-CM | POA: Insufficient documentation

## 2021-05-12 DIAGNOSIS — Z87891 Personal history of nicotine dependence: Secondary | ICD-10-CM | POA: Insufficient documentation

## 2021-05-12 DIAGNOSIS — I10 Essential (primary) hypertension: Secondary | ICD-10-CM | POA: Insufficient documentation

## 2021-05-12 DIAGNOSIS — Z96643 Presence of artificial hip joint, bilateral: Secondary | ICD-10-CM | POA: Insufficient documentation

## 2021-05-12 DIAGNOSIS — S0990XA Unspecified injury of head, initial encounter: Secondary | ICD-10-CM | POA: Insufficient documentation

## 2021-05-12 DIAGNOSIS — W19XXXA Unspecified fall, initial encounter: Secondary | ICD-10-CM

## 2021-05-12 MED ORDER — TEMAZEPAM 15 MG PO CAPS
15.0000 mg | ORAL_CAPSULE | Freq: Every evening | ORAL | Status: DC | PRN
  Filled 2021-05-12: qty 1

## 2021-05-12 NOTE — ED Notes (Signed)
[facility] to transport to [facility] when truck is available

## 2021-05-12 NOTE — ED Notes (Signed)
Secretary Called facility for possible transport.

## 2021-05-12 NOTE — ED Provider Notes (Signed)
[facility] Emergency Department Provider Note ____________________________________________   Event Date/Time   First MD Initiated Contact with Patient 05/12/21 2004     (approximate)  I have reviewed the triage vital signs and the nursing notes.   HISTORY  Chief Complaint Fall  HPI Kristin Woods is a 87 y.o. female presents to the emergency department for treatment and evaluation after sliding out of her wheelchair. Patient denies complaints. Per report from EMS, she was complaining of headache.     Past Medical History:  Diagnosis Date   Elevated cholesterol    Hypertension     There are no problems to display for this patient.   Past Surgical History:  Procedure Laterality Date   arthroscopic shoulder Right 2001   BASAL CELL CARCINOMA EXCISION  2008   on nose   basal joint hand replace  2000   CATARACT EXTRACTION, BILATERAL  1999   child birth  43 and 1954   x 2   CYSTECTOMY  2009   lower back   [place] ARTHROPLASTY Left 12/01   TOTAL HIP ARTHROPLASTY Right 2003    Prior to Admission medications   Medication Sig Start Date End Date Taking? Authorizing Provider  amLODipine (NORVASC) 5 MG tablet Take 5 mg by mouth daily.    [provider]  Atorvastatin Calcium (LIPITOR PO) Take 5 mg by mouth.    [provider]  BIOTIN PO Take by mouth. Take 10,000 mcg daily    [provider]  Calcium Carbonate-Vitamin D (CALCIUM-VITAMIN D3) 600-125 MG-UNIT TABS Take by mouth 2 (two) times daily.    [provider]  Coenzyme Q10 (COQ10) 100 MG CAPS Take by mouth 2 (two) times daily.    [provider]  DHEA 50 MG CAPS Take by mouth daily.    [provider]  Ferrous Sulfate (SLOW FE) 142 (45 FE) MG TBCR Take by mouth daily.    [provider]  furosemide (LASIX) 20 MG tablet Take 20 mg by mouth daily.    [provider]  Ginkgo Biloba 120 MG  CAPS Take by mouth daily.    [provider]  GLUCOSAMINE-CHONDROITIN-MSM PO Take by mouth 2 (two) times daily.    [provider]  Lecithin 1200 MG CAPS Take by mouth daily.    [provider]  Lutein 40 MG CAPS Take by mouth daily.    [provider]  Omega-3 Fatty Acids (FISH OIL) 1360 MG CAPS Take by mouth daily.    [provider]  PRESCRIPTION MEDICATION Estrace Cream, apply one inch to vagina M-W-F    [provider]  Probiotic Product (PROBIOTIC DAILY PO) Take by mouth daily.    [provider]  pyridOXINE (VITAMIN B-6) 100 MG tablet Take 100 mg by mouth daily.    [provider]  temazepam (RESTORIL) 15 MG capsule Take 15 mg by mouth at bedtime.    [provider]  thiamine (VITAMIN B-1) 100 MG tablet Take 100 mg by mouth daily.    [provider]  vitamin B-12 (CYANOCOBALAMIN) 500 MCG tablet Take 500 mcg by mouth daily.    [provider]  vitamin C (ASCORBIC ACID) 500 MG tablet Take 500 mg by mouth daily.    [provider]  vitamin E 400 UNIT capsule Take 400 Units by mouth daily.    [provider]    Allergies Patient has no known  allergies.  No family history on file.  Social History Social History   Tobacco Use   Smoking status: Former   Smokeless tobacco: Never   Tobacco comments:    quit 1956  Substance Use Topics   Alcohol use: Yes    Comment: social    Drug use: No    Review of Systems  Constitutional: No fever/chills Eyes: No visual changes. ENT: No sore throat. Cardiovascular: Denies chest pain. Respiratory: Denies shortness of breath. Gastrointestinal: No abdominal pain.  No nausea, no vomiting.  No diarrhea.  No constipation. Genitourinary: Negative for dysuria. Musculoskeletal: Negative for back pain. Skin: Negative for rash. Neurological: Negative for headaches, focal weakness or  numbness  ____________________________________________   PHYSICAL EXAM:  VITAL SIGNS: ED Triage Vitals  Enc Vitals Group     BP 05/12/21 1920 (!) 150/96     Pulse Rate 05/12/21 1920 74     Resp 05/12/21 1920 18     Temp 05/12/21 1920 98.3 F (36.8 C)     Temp src --      SpO2 05/12/21 1920 96 %     Weight 05/12/21 1919 160 lb 0.9 oz (72.6 kg)     Height 05/12/21 1919 5\' 6"  (1.676 m)     Head Circumference --      Peak Flow --      Pain Score --      Pain Loc --      Pain Edu? --      Excl. in [place]? --     Constitutional: Alert and oriented. Well appearing and in no acute distress. Eyes: Conjunctivae are normal. PERRL. EOMI. Head: Atraumatic. Nose: No congestion/rhinnorhea. Mouth/Throat: Mucous membranes are moist.  Oropharynx non-erythematous. Neck: No stridor.   Hematological/Lymphatic/Immunilogical: No cervical lymphadenopathy. Cardiovascular: Normal rate, regular rhythm. Grossly normal heart sounds.  Good peripheral circulation. Respiratory: Normal respiratory effort.  No retractions. Lungs CTAB. Gastrointestinal: Soft and nontender. No distention. No abdominal bruits. No CVA tenderness. Genitourinary:  Musculoskeletal: Full ROM of extremities.  Neurologic:  Normal speech and language. No gross focal neurologic deficits are appreciated. No gait instability. Skin:  Skin is warm, dry and intact. No rash noted. Psychiatric: Mood and affect are normal. Speech and behavior are normal.  ____________________________________________   LABS (all labs ordered are listed, but only abnormal results are displayed)  Labs Reviewed - No data to display ____________________________________________  EKG  Not indicated. ____________________________________________  RADIOLOGY  ED MD interpretation:    CT head and cervical spine negative for acute concerns.  I, Sherrie George, personally viewed and evaluated these images (plain radiographs) as part of my medical decision  making, as well as reviewing the written report by the radiologist.  Official radiology report(s): CT Head Wo Contrast  Result Date: 05/12/2021 CLINICAL DATA:  Unwitnessed fall. EXAM: CT HEAD WITHOUT CONTRAST TECHNIQUE: Contiguous axial images were obtained from the base of the skull through the vertex without intravenous contrast. COMPARISON:  November 26, 2010 FINDINGS: Brain: There is moderate severity cerebral atrophy with widening of the extra-axial spaces and ventricular dilatation. There are areas of decreased attenuation within the white matter tracts of the supratentorial brain, consistent with microvascular disease changes. Vascular: No hyperdense vessel or unexpected calcification. Skull: Normal. Negative for fracture or focal lesion. Sinuses/Orbits: No acute finding. Other: The study is limited secondary to patient motion. IMPRESSION: 1. Limited study secondary to patient motion. 2. No acute intracranial abnormality. 3. Moderate severity cerebral atrophy and microvascular disease changes of the supratentorial brain. Electronically  Signed   By: Virgina Norfolk M.D.   On: 05/12/2021 19:58   CT Cervical Spine Wo Contrast  Result Date: 05/12/2021 CLINICAL DATA:  Unwitnessed fall. EXAM: CT CERVICAL SPINE WITHOUT CONTRAST TECHNIQUE: Multidetector CT imaging of the cervical spine was performed without intravenous contrast. Multiplanar CT image reconstructions were also generated. COMPARISON:  None. FINDINGS: Alignment: Normal. Skull base and vertebrae: No acute fracture. Chronic changes seen along the tip of the dens. No primary bone lesion or focal pathologic process. Soft tissues and spinal canal: No prevertebral fluid or swelling. No visible canal hematoma. Disc levels: There is marked severity endplate sclerosis and associated anterior osteophyte formation seen at the levels of C2-C3, C3-C4, C4-C5, C5-C6 and C6-C7. There is marked severity narrowing of the anterior atlantoaxial articulation.  Moderate severity intervertebral disc space narrowing is seen at C2-C3, with marked severity intervertebral disc space narrowing seen at C3-C4, C4-C5, C5-C6 and C6-C7. Bilateral marked severity facet joint hypertrophy is noted, right greater than left. Upper chest: Negative. Other: None. IMPRESSION: Marked severity multilevel degenerative changes without evidence of an acute fracture or subluxation. Electronically Signed   By: Virgina Norfolk M.D.   On: 05/12/2021 20:05    ____________________________________________   PROCEDURES  Procedure(s) performed (including Critical Care):  Procedures  ____________________________________________   INITIAL IMPRESSION / ASSESSMENT AND PLAN     87 year old female presents to the emergency department for treatment and evaluation after slipping from her wheelchair and falling to the ground. She denies complaints.  Head and cervical spine CT without acute concerns. Exam is reassuring.      ___________________________________________   FINAL CLINICAL IMPRESSION(S) / ED DIAGNOSES  Final diagnoses:  Fall, initial encounter  Minor head injury, initial encounter     ED Discharge Orders     None        Joni D Rickel was evaluated in Emergency Department on 05/12/2021 for the symptoms described in the history of present illness. She was evaluated in the context of the global COVID-19 pandemic, which necessitated consideration that the patient might be at risk for infection with the SARS-CoV-2 virus that causes COVID-19. Institutional protocols and algorithms that pertain to the evaluation of patients at risk for COVID-19 are in a state of rapid change based on information released by regulatory bodies including the CDC and federal and state organizations. These policies and algorithms were followed during the patient's care in the ED.   Note:  This document was prepared using Dragon voice recognition software and may include unintentional  dictation errors.    Victorino Dike, FNP 05/12/21 2112    Delman Kitten, MD 05/13/21 1840

## 2021-05-12 NOTE — ED Triage Notes (Signed)
Pt BIB EMS from [facility] after a fall when she was trying to get out of her wheelchair today. Pt c/o head pain. No med rec sent with pt

## 2021-05-12 NOTE — ED Notes (Signed)
Pt refused to sign discharge. Pt and facility educated on discharge instructions and verbalized understanding.

## 2021-05-15 DIAGNOSIS — S0083XA Contusion of other part of head, initial encounter: Secondary | ICD-10-CM | POA: Diagnosis not present

## 2021-06-11 DIAGNOSIS — L723 Sebaceous cyst: Secondary | ICD-10-CM | POA: Diagnosis not present

## 2021-07-03 DIAGNOSIS — K5900 Constipation, unspecified: Secondary | ICD-10-CM | POA: Diagnosis not present

## 2021-07-03 DIAGNOSIS — G301 Alzheimer's disease with late onset: Secondary | ICD-10-CM | POA: Diagnosis not present

## 2021-07-03 DIAGNOSIS — F39 Unspecified mood [affective] disorder: Secondary | ICD-10-CM | POA: Diagnosis not present

## 2021-09-05 DIAGNOSIS — F39 Unspecified mood [affective] disorder: Secondary | ICD-10-CM

## 2021-09-05 DIAGNOSIS — M1990 Unspecified osteoarthritis, unspecified site: Secondary | ICD-10-CM

## 2021-09-05 DIAGNOSIS — G309 Alzheimer's disease, unspecified: Secondary | ICD-10-CM | POA: Diagnosis not present

## 2021-09-05 DIAGNOSIS — K5900 Constipation, unspecified: Secondary | ICD-10-CM

## 2021-11-01 DIAGNOSIS — F0150 Vascular dementia without behavioral disturbance: Secondary | ICD-10-CM | POA: Diagnosis not present

## 2021-11-01 DIAGNOSIS — M159 Polyosteoarthritis, unspecified: Secondary | ICD-10-CM | POA: Diagnosis not present

## 2021-11-01 DIAGNOSIS — F39 Unspecified mood [affective] disorder: Secondary | ICD-10-CM | POA: Diagnosis not present

## 2021-11-01 DIAGNOSIS — K5900 Constipation, unspecified: Secondary | ICD-10-CM

## 2021-11-01 DIAGNOSIS — G301 Alzheimer's disease with late onset: Secondary | ICD-10-CM | POA: Diagnosis not present

## 2021-11-05 LAB — CBC AND DIFFERENTIAL
HCT: 30 — AB (ref 36–46)
Hemoglobin: 9.6 — AB (ref 12.0–16.0)
Platelets: 300 10*3/uL (ref 150–400)
WBC: 6.8

## 2021-11-05 LAB — CBC: RBC: 3.55 — AB (ref 3.87–5.11)

## 2021-11-05 LAB — BASIC METABOLIC PANEL
BUN: 18 (ref 4–21)
CO2: 28 — AB (ref 13–22)
Chloride: 104 (ref 99–108)
Creatinine: 0.8 (ref 0.5–1.1)
Glucose: 81
Potassium: 4.3 mEq/L (ref 3.5–5.1)
Sodium: 137 (ref 137–147)

## 2021-11-05 LAB — HEPATIC FUNCTION PANEL
ALT: 11 U/L (ref 7–35)
AST: 16 (ref 13–35)
Alkaline Phosphatase: 75 (ref 25–125)
Bilirubin, Total: 0.4

## 2021-11-05 LAB — COMPREHENSIVE METABOLIC PANEL
Albumin: 3.6 (ref 3.5–5.0)
Calcium: 9 (ref 8.7–10.7)
Globulin: 2.5
eGFR: 67

## 2021-12-05 ENCOUNTER — Emergency Department
Admission: EM | Admit: 2021-12-05 | Discharge: 2021-12-05 | Disposition: A | Discharge status: Home / Self Care | Payer: Medicare Other | Attending: Emergency Medicine | Admitting: Emergency Medicine

## 2021-12-05 ENCOUNTER — Encounter: Payer: Self-pay | Admitting: *Deleted

## 2021-12-05 ENCOUNTER — Other Ambulatory Visit: Payer: Self-pay

## 2021-12-05 ENCOUNTER — Emergency Department: Payer: Medicare Other

## 2021-12-05 DIAGNOSIS — R079 Chest pain, unspecified: Secondary | ICD-10-CM | POA: Diagnosis present

## 2021-12-05 DIAGNOSIS — D72829 Elevated white blood cell count, unspecified: Secondary | ICD-10-CM | POA: Insufficient documentation

## 2021-12-05 DIAGNOSIS — F0390 Unspecified dementia without behavioral disturbance: Secondary | ICD-10-CM | POA: Insufficient documentation

## 2021-12-05 LAB — HEPATIC FUNCTION PANEL
ALT: 18 U/L (ref 0–44)
AST: 29 U/L (ref 15–41)
Albumin: 3.8 g/dL (ref 3.5–5.0)
Alkaline Phosphatase: 82 U/L (ref 38–126)
Bilirubin, Direct: <0.1 mg/dL (ref 0.0–0.2)
Total Bilirubin: 0.8 mg/dL (ref 0.3–1.2)
Total Protein: 6.8 g/dL (ref 6.5–8.1)

## 2021-12-05 LAB — CBC
HCT: 30.5 % — ABNORMAL LOW (ref 36.0–46.0)
Hemoglobin: 9.5 g/dL — ABNORMAL LOW (ref 12.0–15.0)
MCH: 26.5 pg (ref 26.0–34.0)
MCHC: 31.1 g/dL (ref 30.0–36.0)
MCV: 85 fL (ref 80.0–100.0)
Platelets: 307 10*3/uL (ref 150–400)
RBC: 3.59 MIL/uL — ABNORMAL LOW (ref 3.87–5.11)
RDW: 15.1 % (ref 11.5–15.5)
WBC: 12 10*3/uL — ABNORMAL HIGH (ref 4.0–10.5)
nRBC: 0 % (ref 0.0–0.2)

## 2021-12-05 LAB — LIPASE, BLOOD: Lipase: 35 U/L (ref 11–51)

## 2021-12-05 LAB — TROPONIN I (HIGH SENSITIVITY)
Troponin I (High Sensitivity): 6 ng/L (ref <18)
Troponin I (High Sensitivity): 6 ng/L (ref <18)

## 2021-12-05 LAB — BASIC METABOLIC PANEL
Anion gap: 9 (ref 5–15)
BUN: 19 mg/dL (ref 8–23)
CO2: 26 mmol/L (ref 22–32)
Calcium: 8.8 mg/dL — ABNORMAL LOW (ref 8.9–10.3)
Chloride: 101 mmol/L (ref 98–111)
Creatinine, Ser: 0.77 mg/dL (ref 0.44–1.00)
GFR, Estimated: >60 mL/min (ref >60)
Glucose, Bld: 110 mg/dL — ABNORMAL HIGH (ref 70–99)
Potassium: 4 mmol/L (ref 3.5–5.1)
Sodium: 136 mmol/L (ref 135–145)

## 2021-12-05 MED ORDER — ALUM & MAG HYDROXIDE-SIMETH 200-200-20 MG/5ML PO SUSP
30.0000 mL | Freq: Once | ORAL | Status: AC
Start: 2021-12-05 — End: 2021-12-05
  Administered 2021-12-05: 30 mL via ORAL
  Filled 2021-12-05: qty 30

## 2021-12-05 NOTE — ED Provider Notes (Signed)
[facility] Provider Note    Event Date/Time   First MD Initiated Contact with Patient 12/05/21 2011     (approximate)   History   Chest Pain   HPI  Kristin Woods is a 88 y.o. female who presents to the emergency department today from living facility because of concerns for chest pain.  Fortunately the patient does have dementia so cannot give a good history.  When asked where she was having pain she points to her upper abdomen.  Patient denies any shortness of breath.  Denies cough.  Denies similar symptoms in the past.   Physical Exam   Triage Vital Signs: ED Triage Vitals  Enc Vitals Group     BP 12/05/21 1715 (!) 161/67     Pulse Rate 12/05/21 1715 100     Resp 12/05/21 1715 16     Temp 12/05/21 1715 99.2 F (37.3 C)     Temp Source 12/05/21 1715 Oral     SpO2 12/05/21 1715 93 %     Weight 12/05/21 1718 158 lb 11.7 oz (72 kg)     Height 12/05/21 1718 '5\' 6"'$  (1.676 m)     Head Circumference --      Peak Flow --      Pain Score --      Pain Loc --      Pain Edu? --      Excl. in [place]? --     Most recent vital signs: Vitals:   12/05/21 1715 12/05/21 2000  BP: (!) 161/67 (!) 146/80  Pulse: 100 (!) 101  Resp: 16 (!) 25  Temp: 99.2 F (37.3 C)   SpO2: 93% 96%    General: Awake, alert, not completely oriented.  CV:  Good peripheral perfusion. Regular rate and rhythm. Resp:  Normal effort. Lungs clear. Abd:  No distention. Non tender.    ED Results / Procedures / Treatments   Labs (all labs ordered are listed, but only abnormal results are displayed) Labs Reviewed  BASIC METABOLIC PANEL - Abnormal; Notable for the following components:      Result Value   Glucose, Bld 110 (*)    Calcium 8.8 (*)    All other components within normal limits  CBC - Abnormal; Notable for the following components:   WBC 12.0 (*)    RBC 3.59 (*)    Hemoglobin 9.5 (*)    HCT 30.5 (*)    All other components within normal limits  TROPONIN I (HIGH  SENSITIVITY)  TROPONIN I (HIGH SENSITIVITY)     EKG  I, Nance Pear, attending physician, personally viewed and interpreted this EKG  EKG Time: 1719 Rate: 100 Rhythm: sinus rhythm with PVC Axis: normal Intervals: qtc 485 QRS: narrow, q waves v1, v2 ST changes: no st elevation Impression: abnormal ekg    RADIOLOGY I independently interpreted and visualized the CXR. My interpretation: No pneumonia. No pneumothorax Radiology interpretation:  IMPRESSION:  There are no signs of pulmonary edema or focal pulmonary  consolidation. Large fixed hiatal hernia.     PROCEDURES:  Critical Care performed: No  Procedures   MEDICATIONS ORDERED IN ED: Medications - No data to display   IMPRESSION / MDM / [place] / ED COURSE  I reviewed the triage vital signs and the nursing notes.                              Differential  diagnosis includes, but is not limited to, ACS, pneumonia, pneumothorax, pancreatitis, gastritis, esophagitis.  Patient's presentation is most consistent with acute presentation with potential threat to life or bodily function.  Patient presents to the emergency department today because of concerns for chest pain.  On exam she points more to the epigastric region.  Her abdomen is nontender.  Blood work here with very mild leukocytosis.  Some anemia.  Unfortunately I do not see any recent blood work to compare to.  Electrolytes without any concerning electrolyte abnormality.  Lipase hepatic function panel negative.  Troponin was negative x2.  Patient stated after getting some Maalox that she no longer felt the discomfort.  At this time I do wonder if patient suffering from some esophagitis or gastritis.  It does appear patient has a hiatal hernia on chest x-ray.  This time given clinical improvement and reassuring blood work will plan on discharging back to living facility.  FINAL CLINICAL IMPRESSION(S) / ED DIAGNOSES   Final diagnoses:   Nonspecific chest pain     Note:  This document was prepared using Dragon voice recognition software and may include unintentional dictation errors.    Nance Pear, MD 12/05/21 2241

## 2021-12-05 NOTE — Discharge Instructions (Signed)
Please seek medical attention for any high fevers, chest pain, shortness of breath, change in behavior, persistent vomiting, bloody stool or any other new or concerning symptoms.  

## 2021-12-05 NOTE — ED Triage Notes (Signed)
Pt brought in via ems from [facility].  Pt has chest pain.   Per ems, ntg was given at the facility.  Pt has alz dz.  Pt alert.

## 2021-12-05 NOTE — ED Notes (Signed)
Patient resting in bed free from sign of distress. Breathing unlabored speaking in full sentences with symmetric chest rise and fall. Bed low and locked with side rails raised x2. Call bell in reach and monitor in place.   

## 2021-12-05 NOTE — ED Notes (Signed)
[facility] called for transport back to [facility]

## 2022-01-02 DIAGNOSIS — K5909 Other constipation: Secondary | ICD-10-CM | POA: Diagnosis not present

## 2022-01-02 DIAGNOSIS — F39 Unspecified mood [affective] disorder: Secondary | ICD-10-CM | POA: Diagnosis not present

## 2022-01-02 DIAGNOSIS — G309 Alzheimer's disease, unspecified: Secondary | ICD-10-CM | POA: Diagnosis not present

## 2022-01-02 DIAGNOSIS — M1990 Unspecified osteoarthritis, unspecified site: Secondary | ICD-10-CM | POA: Diagnosis not present

## 2022-03-07 ENCOUNTER — Encounter: Payer: Self-pay | Admitting: Nurse Practitioner

## 2022-03-07 ENCOUNTER — Non-Acute Institutional Stay (SKILLED_NURSING_FACILITY): Payer: Medicare Other | Admitting: Nurse Practitioner

## 2022-03-07 DIAGNOSIS — G301 Alzheimer's disease with late onset: Secondary | ICD-10-CM | POA: Diagnosis not present

## 2022-03-07 DIAGNOSIS — K5904 Chronic idiopathic constipation: Secondary | ICD-10-CM

## 2022-03-07 DIAGNOSIS — F39 Unspecified mood [affective] disorder: Secondary | ICD-10-CM | POA: Diagnosis not present

## 2022-03-07 DIAGNOSIS — M1991 Primary osteoarthritis, unspecified site: Secondary | ICD-10-CM

## 2022-03-07 DIAGNOSIS — F02B0 Dementia in other diseases classified elsewhere, moderate, without behavioral disturbance, psychotic disturbance, mood disturbance, and anxiety: Secondary | ICD-10-CM

## 2022-03-07 DIAGNOSIS — E559 Vitamin D deficiency, unspecified: Secondary | ICD-10-CM

## 2022-03-07 NOTE — Progress Notes (Signed)
Location:  Other Nursing Home Room Number: [place] of Service:  SNF (31)  [place] Pink, MD  Patient Care Team: [place] Pink, MD as PCP - General [facility])  Extended Emergency Contact Information Primary Emergency Contact: [facility] Address: [address]          [address]  [place] Home Phone: [phone] Relation: None Secondary Emergency Contact: Remus Blake Address: [address]          [place], [place] [address] Home Phone: [phone] Relation: None  Goals of care: Advanced Directive information    03/07/2022   12:25 PM  Advanced Directives  Does Patient Have a Medical Advance Directive? Yes  Type of Advance Directive Out of facility DNR (pink MOST or yellow form)  Does patient want to make changes to medical advance directive? No - Patient declined     Chief Complaint  Patient presents with   Medical Management of Chronic Issues    Routine follow up   Quality Metric Gaps    Medicare annual wellness visit,dexa scan   Immunizations    COVID vaccine, tetanus/tdap, shingrix vaccine, pneumonia vaccine and flu vaccine due    HPI:  Pt is a 88 y.o. female seen today for routine follow up on chronic conditions.  Pt with hx of dementia, constipation, mood disorder, htn.  She has been doing well. Nursing has no concerns. She is pleasantly confused and has no complaints.  Self propels in WC down the hall.  Past Medical History:  Diagnosis Date   Elevated cholesterol    Hypertension    Past Surgical History:  Procedure Laterality Date   arthroscopic shoulder Right 2001   BASAL CELL CARCINOMA EXCISION  2008   on nose   basal joint hand replace  2000   CATARACT EXTRACTION, BILATERAL  1999   child birth  89 and 1954   x 2   CYSTECTOMY  2009   lower back   PILONIDAL CYST EXCISION  1951   TOTAL HIP ARTHROPLASTY Left 12/01   TOTAL HIP ARTHROPLASTY Right 2003    Allergies  Allergen Reactions   Etodolac Other (See Comments)     Increased BP    Outpatient Encounter Medications as of 03/07/2022  Medication Sig   acetaminophen (TYLENOL) 325 MG tablet Take 650 mg by mouth every 6 (six) hours as needed.   acetaminophen (TYLENOL) 650 MG CR tablet Take 650 mg by mouth every 4 (four) hours as needed for pain.   escitalopram (LEXAPRO) 10 MG tablet Take 10 mg by mouth daily. Give 1 and 1/2 tablet day   hydrocortisone 2.5 % cream Apply topically as needed.   lidocaine (LIDODERM) 5 % Place 1 patch onto the skin daily. Apply to interior left hip   loperamide (IMODIUM A-D) 2 MG tablet Take 2 mg by mouth as needed for diarrhea or loose stools.   Magnesium Hydroxide (MILK OF MAGNESIA PO) Take 30 mLs by mouth every 12 (twelve) hours as needed.   Multiple Vitamins-Minerals (MULTIVITAMINS THER. W/MINERALS) TABS tablet Take 1 tablet by mouth daily.   polyethylene glycol (MIRALAX / GLYCOLAX) 17 g packet Take 17 g by mouth daily.   Vitamin D, Ergocalciferol, (DRISDOL) 1.25 MG (50000 UNIT) CAPS capsule Take 50,000 Units by mouth every 30 (thirty) days.   [DISCONTINUED] fluPHENAZine decanoate (PROLIXIN) 25 MG/ML injection Inject 50 mg into the muscle every 21 ( twenty-one) days.   [DISCONTINUED] amLODipine (NORVASC) 5 MG tablet Take 5 mg by mouth daily.   [DISCONTINUED] Atorvastatin Calcium (  LIPITOR PO) Take 5 mg by mouth.   [DISCONTINUED] BIOTIN PO Take by mouth. Take 10,000 mcg daily   [DISCONTINUED] Calcium Carbonate-Vitamin D (CALCIUM-VITAMIN D3) 600-125 MG-UNIT TABS Take by mouth 2 (two) times daily.   [DISCONTINUED] Coenzyme Q10 (COQ10) 100 MG CAPS Take by mouth 2 (two) times daily.   [DISCONTINUED] DHEA 50 MG CAPS Take by mouth daily.   [DISCONTINUED] Ferrous Sulfate (SLOW FE) 142 (45 FE) MG TBCR Take by mouth daily.   [DISCONTINUED] furosemide (LASIX) 20 MG tablet Take 20 mg by mouth daily.   [DISCONTINUED] Ginkgo Biloba 120 MG CAPS Take by mouth daily.   [DISCONTINUED] GLUCOSAMINE-CHONDROITIN-MSM PO Take by mouth 2 (two) times  daily.   [DISCONTINUED] Lecithin 1200 MG CAPS Take by mouth daily.   [DISCONTINUED] Lutein 40 MG CAPS Take by mouth daily.   [DISCONTINUED] Omega-3 Fatty Acids (FISH OIL) 1360 MG CAPS Take by mouth daily.   [DISCONTINUED] PRESCRIPTION MEDICATION Estrace Cream, apply one inch to vagina M-W-F   [DISCONTINUED] Probiotic Product (PROBIOTIC DAILY PO) Take by mouth daily.   [DISCONTINUED] pyridOXINE (VITAMIN B-6) 100 MG tablet Take 100 mg by mouth daily.   [DISCONTINUED] temazepam (RESTORIL) 15 MG capsule Take 15 mg by mouth at bedtime.   [DISCONTINUED] thiamine (VITAMIN B-1) 100 MG tablet Take 100 mg by mouth daily.   [DISCONTINUED] vitamin B-12 (CYANOCOBALAMIN) 500 MCG tablet Take 500 mcg by mouth daily.   [DISCONTINUED] vitamin C (ASCORBIC ACID) 500 MG tablet Take 500 mg by mouth daily.   [DISCONTINUED] vitamin E 400 UNIT capsule Take 400 Units by mouth daily.   No facility-administered encounter medications on file as of 03/07/2022.    Review of Systems  Constitutional:  Negative for activity change, appetite change, fatigue and unexpected weight change.  HENT:  Negative for congestion and hearing loss.   Eyes: Negative.   Respiratory:  Negative for cough and shortness of breath.   Cardiovascular:  Negative for chest pain, palpitations and leg swelling.  Gastrointestinal:  Negative for abdominal pain, constipation and diarrhea.  Genitourinary:  Negative for difficulty urinating and dysuria.  Musculoskeletal:  Negative for arthralgias and myalgias.  Skin:  Negative for color change and wound.  Neurological:  Negative for dizziness and weakness.  Psychiatric/Behavioral:  Positive for confusion. Negative for agitation and behavioral problems.     Immunization History  Administered Date(s) Administered   Influenza-Unspecified 02/26/2022   Pneumococcal Polysaccharide-23 10/12/2011   Pertinent  Health Maintenance Due  Topic Date Due   DEXA SCAN  Never done   INFLUENZA VACCINE  Completed       05/12/2021    7:20 PM 12/05/2021    5:21 PM  Fall Risk  Patient Fall Risk Level High fall risk High fall risk   Functional Status Survey:    Vitals:   03/07/22 1226  BP: 110/68  Pulse: 78  Resp: (!) 30  Temp: 97.7 F (36.5 C)  SpO2: 92%  Weight: 117 lb 6.4 oz (53.3 kg)  Height: 5' (1.524 m)   Body mass index is 22.93 kg/m. Physical Exam Constitutional:      General: She is not in acute distress.    Appearance: She is well-developed. She is not diaphoretic.  HENT:     Head: Normocephalic and atraumatic.     Mouth/Throat:     Pharynx: No oropharyngeal exudate.  Eyes:     Conjunctiva/sclera: Conjunctivae normal.     Pupils: Pupils are equal, round, and reactive to light.  Cardiovascular:     Rate  and Rhythm: Normal rate and regular rhythm.     Heart sounds: Normal heart sounds.  Pulmonary:     Effort: Pulmonary effort is normal.     Breath sounds: Normal breath sounds.  Abdominal:     General: Bowel sounds are normal.     Palpations: Abdomen is soft.  Musculoskeletal:     Cervical back: Normal range of motion and neck supple.     Right lower leg: No edema.     Left lower leg: No edema.  Skin:    General: Skin is warm and dry.  Neurological:     Mental Status: She is alert. Mental status is at baseline.     Gait: Gait abnormal.  Psychiatric:        Mood and Affect: Mood normal.     Labs reviewed: Recent Labs    12/05/21 1722  NA 136  K 4.0  CL 101  CO2 26  GLUCOSE 110*  BUN 19  CREATININE 0.77  CALCIUM 8.8*   Recent Labs    12/05/21 1722  AST 29  ALT 18  ALKPHOS 82  BILITOT 0.8  PROT 6.8  ALBUMIN 3.8   Recent Labs    12/05/21 1722  WBC 12.0*  HGB 9.5*  HCT 30.5*  MCV 85.0  PLT 307   Lab Results  Component Value Date   TSH 1.78 01/14/2014   No results found for: "HGBA1C" No results found for: "CHOL", "HDL", "LDLCALC", "LDLDIRECT", "TRIG", "CHOLHDL"  Significant Diagnostic Results in last 30 days:  No results  found.  Assessment/Plan 1. Moderate late onset Alzheimer's dementia without behavioral disturbance, psychotic disturbance, mood disturbance, or anxiety (HCC) -Stable, no acute changes in cognitive or functional status, continue supportive care.   2. Primary osteoarthritis, unspecified site -controlled with scheduled tylenol  3. Chronic idiopathic constipation Stable on miralax daily  4. Mood disorder ([place]) Controlled on lexapro  5. Vitamin D deficiency Stable, continue vit D supplement monthly.    Carlos American. [place], [facility] [phone]

## 2022-03-07 NOTE — Progress Notes (Signed)
Location:   [place] Room Number: [place] of Service:  SNF [phone]) Provider:  Dewayne Shorter, MD  [place] Pink, MD  Patient Care Team: [place] Pink, MD as PCP - General (Family Medicine)  Extended Emergency Contact Information Primary Emergency Contact: [facility] Address: [address]          [place] Home Phone: [phone] Relation: None Secondary Emergency Contact: Remus Blake Address: [address]          [place], [place] [address] Home Phone: [phone] Relation: None  Code Status:  DNR Goals of care: Advanced Directive information    03/07/2022   12:25 PM  Advanced Directives  Does Patient Have a Medical Advance Directive? Yes  Type of Advance Directive Out of facility DNR (pink MOST or yellow form)  Does patient want to make changes to medical advance directive? No - Patient declined     Chief Complaint  Patient presents with   Medical Management of Chronic Issues    Routine follow up   Quality Metric Gaps    Medicare annual wellness visit,dexa scan   Immunizations    COVID vaccine, tetanus/tdap, shingrix vaccine, pneumonia vaccine and flu vaccine due    HPI:  Pt is a 88 y.o. female seen today for medical management of chronic diseases.     Past Medical History:  Diagnosis Date   Elevated cholesterol    Hypertension    Past Surgical History:  Procedure Laterality Date   arthroscopic shoulder Right 2001   BASAL CELL CARCINOMA EXCISION  2008   on nose   basal joint hand replace  2000   CATARACT EXTRACTION, BILATERAL  1999   child birth  4 and 1954   x 2   CYSTECTOMY  2009   lower back   PILONIDAL CYST EXCISION  1951   TOTAL HIP ARTHROPLASTY Left 12/01   TOTAL HIP ARTHROPLASTY Right 2003    Allergies  Allergen Reactions   Etodolac Other (See Comments)    Increased BP    Allergies as of 03/07/2022       Reactions   Etodolac Other (See Comments)   Increased BP         Medication List        Accurate as of March 07, 2022  2:41 PM. If you have any questions, ask your nurse or doctor.          STOP taking these medications    amLODipine 5 MG tablet Commonly known as: NORVASC Stopped by: Lauree Chandler, NP   ascorbic acid 500 MG tablet Commonly known as: VITAMIN C Stopped by: Lauree Chandler, NP   BIOTIN PO Stopped by: Lauree Chandler, NP   Calcium-Vitamin D3 600-125 MG-UNIT Tabs Stopped by: Lauree Chandler, NP   CoQ10 100 MG Caps Stopped by: Lauree Chandler, NP   DHEA 50 MG Caps Stopped by: Lauree Chandler, NP   Fish Oil 1360 MG Caps Stopped by: Lauree Chandler, NP   furosemide 20 MG tablet Commonly known as: LASIX Stopped by: Lauree Chandler, NP   Ginkgo Biloba 120 MG Caps Stopped by: Lauree Chandler, NP   GLUCOSAMINE-CHONDROITIN-MSM PO Stopped by: Lauree Chandler, NP   Lecithin 1200 MG Caps Stopped by: Lauree Chandler, NP   LIPITOR PO Stopped by: Lauree Chandler, NP   Lutein 40 MG Caps Stopped by: Lauree Chandler, NP   PRESCRIPTION MEDICATION Stopped by: Lauree Chandler,  NP   PROBIOTIC DAILY PO Stopped by: Lauree Chandler, NP   pyridOXINE 100 MG tablet Commonly known as: VITAMIN B6 Stopped by: Lauree Chandler, NP   Slow Fe 142 (45 Fe) MG Tbcr Generic drug: Ferrous Sulfate Stopped by: Lauree Chandler, NP   temazepam 15 MG capsule Commonly known as: RESTORIL Stopped by: Lauree Chandler, NP   thiamine 100 MG tablet Commonly known as: Vitamin B-1 Stopped by: Lauree Chandler, NP   vitamin B-12 500 MCG tablet Commonly known as: CYANOCOBALAMIN Stopped by: Lauree Chandler, NP   vitamin E 180 MG (400 UNITS) capsule Stopped by: Lauree Chandler, NP       TAKE these medications    acetaminophen 650 MG CR tablet Commonly known as: TYLENOL Take 650 mg by mouth every 4 (four) hours as needed for pain.   acetaminophen 325 MG tablet Commonly known as:  TYLENOL Take 650 mg by mouth every 6 (six) hours as needed.   escitalopram 10 MG tablet Commonly known as: LEXAPRO Take 10 mg by mouth daily. Give 1 and 1/2 tablet day   hydrocortisone 2.5 % cream Apply topically as needed.   lidocaine 5 % Commonly known as: LIDODERM Place 1 patch onto the skin daily. Apply to interior left hip   loperamide 2 MG tablet Commonly known as: IMODIUM A-D Take 2 mg by mouth as needed for diarrhea or loose stools.   MILK OF MAGNESIA PO Take 30 mLs by mouth every 12 (twelve) hours as needed.   multivitamins ther. w/minerals Tabs tablet Take 1 tablet by mouth daily.   polyethylene glycol 17 g packet Commonly known as: MIRALAX / GLYCOLAX Take 17 g by mouth daily.   Vitamin D (Ergocalciferol) 1.25 MG (50000 UNIT) Caps capsule Commonly known as: DRISDOL Take 50,000 Units by mouth every 30 (thirty) days.        Review of Systems  Immunization History  Administered Date(s) Administered   Influenza-Unspecified 02/26/2022   Pneumococcal Polysaccharide-23 10/12/2011   Pertinent  Health Maintenance Due  Topic Date Due   DEXA SCAN  Never done   INFLUENZA VACCINE  Completed      05/12/2021    7:20 PM 12/05/2021    5:21 PM  Fall Risk  Patient Fall Risk Level High fall risk High fall risk   Functional Status Survey:    Vitals:   03/07/22 1226  BP: 110/68  Pulse: 78  Resp: (!) 30  Temp: 97.7 F (36.5 C)  SpO2: 92%  Weight: 117 lb 6.4 oz (53.3 kg)  Height: 5' (1.524 m)   Body mass index is 22.93 kg/m. Physical Exam  Labs reviewed: Recent Labs    12/05/21 1722  NA 136  K 4.0  CL 101  CO2 26  GLUCOSE 110*  BUN 19  CREATININE 0.77  CALCIUM 8.8*   Recent Labs    12/05/21 1722  AST 29  ALT 18  ALKPHOS 82  BILITOT 0.8  PROT 6.8  ALBUMIN 3.8   Recent Labs    12/05/21 1722  WBC 12.0*  HGB 9.5*  HCT 30.5*  MCV 85.0  PLT 307   Lab Results  Component Value Date   TSH 1.78 01/14/2014   No results found for:  "HGBA1C" No results found for: "CHOL", "HDL", "LDLCALC", "LDLDIRECT", "TRIG", "CHOLHDL"  Significant Diagnostic Results in last 30 days:  No results found.  Assessment/Plan There are no diagnoses linked to this encounter.   Family/ staff Communication:   Labs/tests  ordered:

## 2022-03-29 ENCOUNTER — Encounter: Payer: Self-pay | Admitting: Student

## 2022-03-29 ENCOUNTER — Non-Acute Institutional Stay (SKILLED_NURSING_FACILITY): Payer: Medicare Other | Admitting: Student

## 2022-03-29 DIAGNOSIS — R112 Nausea with vomiting, unspecified: Secondary | ICD-10-CM | POA: Diagnosis not present

## 2022-03-29 DIAGNOSIS — L03312 Cellulitis of back [any part except buttock]: Secondary | ICD-10-CM | POA: Diagnosis not present

## 2022-03-29 LAB — BASIC METABOLIC PANEL
BUN: 22 — AB (ref 4–21)
CO2: 26 — AB (ref 13–22)
Chloride: 106 (ref 99–108)
Creatinine: 0.9 (ref 0.5–1.1)
Glucose: 76
Potassium: 4.5 mEq/L (ref 3.5–5.1)
Sodium: 138 (ref 137–147)

## 2022-03-29 LAB — HEPATIC FUNCTION PANEL
ALT: 11 U/L (ref 7–35)
AST: 30 (ref 13–35)
Alkaline Phosphatase: 86 (ref 25–125)
Bilirubin, Total: 0.3

## 2022-03-29 LAB — CBC AND DIFFERENTIAL
HCT: 26 — AB (ref 36–46)
Hemoglobin: 7.7 — AB (ref 12.0–16.0)
Neutrophils Absolute: 4147
Platelets: 389 10*3/uL (ref 150–400)
WBC: 6.9

## 2022-03-29 LAB — COMPREHENSIVE METABOLIC PANEL
Albumin: 3.7 (ref 3.5–5.0)
Calcium: 9.1 (ref 8.7–10.7)
Globulin: 2.8

## 2022-03-29 LAB — CBC: RBC: 3.41 — AB (ref 3.87–5.11)

## 2022-03-29 NOTE — Progress Notes (Signed)
Location:  Other [place].  Nursing Home Room Number: [place] of Service:  SNF [phone]) Provider:  Dr. Wynn Banker, Jordan Hawks, MD  Patient Care Team: Dewayne Shorter, MD as PCP - General [facility])  Extended Emergency Contact Information Primary Emergency Contact: [facility] Address: [address]          [place] Home Phone: [phone] Relation: None Secondary Emergency Contact: Remus Blake Address: [address]          [place], [place] [address] Home Phone: [phone] Relation: None  Code Status:  DNR Goals of care: Advanced Directive information    03/29/2022    9:38 AM  Advanced Directives  Does Patient Have a Medical Advance Directive? Yes  Type of Advance Directive Out of facility DNR (pink MOST or yellow form)  Does patient want to make changes to medical advance directive? No - Patient declined     Chief Complaint  Patient presents with   Acute Visit    Cellulitis.     HPI:  Pt is a 88 y.o. female seen today for an acute visit for celluliits. She has a large, indurated red area on her mid back. Upon entry to the room she has vomit in her hands and on her clothes. When asked if she feels sick, she says no. Asked if she feels nauseated, she says no. Asked if her back hurts where the red area is located she says, "well it doesn't feel good but it doesn't hurt." She says the same with regards to abdominal pain. Nursing states she vomits again after getting cleaned up.    Past Medical History:  Diagnosis Date   Elevated cholesterol    Hypertension    Past Surgical History:  Procedure Laterality Date   arthroscopic shoulder Right 2001   BASAL CELL CARCINOMA EXCISION  2008   on nose   basal joint hand replace  2000   CATARACT EXTRACTION, BILATERAL  1999   child birth  44 and 1954   x 2   CYSTECTOMY  2009   lower back   PILONIDAL CYST EXCISION  1951   TOTAL HIP ARTHROPLASTY Left 12/01   TOTAL HIP  ARTHROPLASTY Right 2003    Allergies  Allergen Reactions   Etodolac Other (See Comments)    Increased BP   Tape     Outpatient Encounter Medications as of 03/29/2022  Medication Sig   acetaminophen (TYLENOL) 325 MG tablet Take 650 mg by mouth every 8 (eight) hours as needed.   acetaminophen (TYLENOL) 650 MG CR tablet Take 650 mg by mouth 3 (three) times daily.   escitalopram (LEXAPRO) 10 MG tablet Give One and Half tablet by mouth once daily   hydrocortisone 2.5 % cream Apply to hemorrhoids topically three times daily as needed.   Infant Care Products [facility]) OINT Apply to sacrum, coccyx,buttocks topically as needed for redness.   lidocaine (LIDODERM) 5 % Place 1 patch onto the skin daily. Apply to interior left hip   loperamide (IMODIUM A-D) 2 MG tablet Take 2 mg by mouth as needed for diarrhea or loose stools.   Magnesium Hydroxide (MILK OF MAGNESIA PO) Take 30 mLs by mouth every 12 (twelve) hours as needed.   Multiple Vitamins-Minerals (MULTIVITAMINS THER. W/MINERALS) TABS tablet Take 1 tablet by mouth daily.   polyethylene glycol (MIRALAX / GLYCOLAX) 17 g packet Take 17 g by mouth daily.   Vitamin D, Ergocalciferol, (DRISDOL) 1.25 MG (50000 UNIT) CAPS capsule Take 50,000 Units  by mouth every 30 (thirty) days.   No facility-administered encounter medications on file as of 03/29/2022.    Review of Systems  Unable to perform ROS: Dementia    Immunization History  Administered Date(s) Administered   Influenza-Unspecified 02/26/2022   MODERNA COVID-19 SARS-COV-2 PEDS BIVALENT BOOSTER 6Y-11Y 03/22/2022   Pneumococcal Polysaccharide-23 10/12/2011   Pertinent  Health Maintenance Due  Topic Date Due   DEXA SCAN  Never done   INFLUENZA VACCINE  Completed      05/12/2021    7:20 PM 12/05/2021    5:21 PM  Fall Risk  Patient Fall Risk Level High fall risk High fall risk   Functional Status Survey:    Vitals:   03/29/22 0921  BP: 137/80  Pulse: 90  Resp: 16  Temp:  99.1 F (37.3 C)  SpO2: 98%  Weight: 123 lb 6.4 oz (56 kg)  Height: '5\' 6"'$  (1.676 m)   Body mass index is 19.92 kg/m. Physical Exam Constitutional:      Comments: Patient with vomit and dentures in her hands.   Cardiovascular:     Rate and Rhythm: Normal rate and regular rhythm.  Pulmonary:     Effort: Pulmonary effort is normal.     Breath sounds: Normal breath sounds.  Abdominal:     Comments: Hyperactive bowel sounds, distension, tenderness to palpation.   Skin:    Comments: 2.5 cm erythematous, indurated area on left mid-back. Tender to palpation  Neurological:     Mental Status: She is alert.     Labs reviewed: Recent Labs    11/05/21 0000 12/05/21 1722  NA 137 136  K 4.3 4.0  CL 104 101  CO2 28* 26  GLUCOSE  --  110*  BUN 18 19  CREATININE 0.8 0.77  CALCIUM 9.0 8.8*   Recent Labs    11/05/21 0000 12/05/21 1722  AST 16 29  ALT 11 18  ALKPHOS 75 82  BILITOT  --  0.8  PROT  --  6.8  ALBUMIN 3.6 3.8   Recent Labs    11/05/21 0000 12/05/21 1722  WBC 6.8 12.0*  HGB 9.6* 9.5*  HCT 30* 30.5*  MCV  --  85.0  PLT 300 307   Lab Results  Component Value Date   TSH 1.78 01/14/2014   No results found for: "HGBA1C" No results found for: "CHOL", "HDL", "LDLCALC", "LDLDIRECT", "TRIG", "CHOLHDL"  Significant Diagnostic Results in last 30 days:  No results found.  Assessment/Plan 1. Cellulitis of back except buttock Patient with 1 day of erythema, tenderness, induration of left mid-back. No pus coming from the area, however, a central pustule is present. Doxycycline 100 mg BID for 10 days. CTM. Warm Compresses as tolerated.   2. Nausea and vomiting, unspecified vomiting type Patient with vomiting x2 today. Unclear etiology given patient's dementia unable to clearly communicate. CBC, BMP, KUB. Zofran 8 mg q8hrs PRN for nausea/vomiting. Encourage fluids.     Family/ staff Communication: Attempted to call family, unable to discuss plan. Discussed plan  with nursing staff.   Labs/tests ordered:  CBC, BMP, KUB.   Tomasa Rand, MD, [facility] [phone]

## 2022-04-03 LAB — CBC AND DIFFERENTIAL
HCT: 26 — AB (ref 36–46)
Hemoglobin: 7.9 — AB (ref 12.0–16.0)
Neutrophils Absolute: 3836
Platelets: 409 10*3/uL — AB (ref 150–400)
WBC: 7

## 2022-04-03 LAB — CBC: RBC: 3.48 — AB (ref 3.87–5.11)

## 2022-05-02 ENCOUNTER — Non-Acute Institutional Stay (SKILLED_NURSING_FACILITY): Payer: Medicare Other | Admitting: Nurse Practitioner

## 2022-05-02 ENCOUNTER — Encounter: Payer: Self-pay | Admitting: Nurse Practitioner

## 2022-05-02 DIAGNOSIS — F02B0 Dementia in other diseases classified elsewhere, moderate, without behavioral disturbance, psychotic disturbance, mood disturbance, and anxiety: Secondary | ICD-10-CM

## 2022-05-02 DIAGNOSIS — D509 Iron deficiency anemia, unspecified: Secondary | ICD-10-CM | POA: Diagnosis not present

## 2022-05-02 DIAGNOSIS — F39 Unspecified mood [affective] disorder: Secondary | ICD-10-CM | POA: Diagnosis not present

## 2022-05-02 DIAGNOSIS — K5904 Chronic idiopathic constipation: Secondary | ICD-10-CM | POA: Diagnosis not present

## 2022-05-02 DIAGNOSIS — M1991 Primary osteoarthritis, unspecified site: Secondary | ICD-10-CM

## 2022-05-02 DIAGNOSIS — E559 Vitamin D deficiency, unspecified: Secondary | ICD-10-CM

## 2022-05-02 DIAGNOSIS — G301 Alzheimer's disease with late onset: Secondary | ICD-10-CM | POA: Diagnosis not present

## 2022-05-02 NOTE — Progress Notes (Signed)
Location:  Other [facility]) Nursing Home Room Number: [address] of Service:  SNF [phone]) Provider:  Carlos American. Dewaine Oats, NP   Patient Care Team: Dewayne Shorter, MD as PCP - General [facility])  Extended Emergency Contact Information Primary Emergency Contact: [facility] Address: [place]          [place] Home Phone: [phone] Relation: None Secondary Emergency Contact: Remus Blake Address: [address]          [place], [place] [address] Home Phone: [phone] Relation: None  Code Status:  DNR Goals of care: Advanced Directive information    03/29/2022    9:38 AM  Advanced Directives  Does Patient Have a Medical Advance Directive? Yes  Type of Advance Directive Out of facility DNR (pink MOST or yellow form)  Does patient want to make changes to medical advance directive? No - Patient declined     Chief Complaint  Patient presents with   Medical Management of Chronic Issues    Routine visit. Discuss need for annual wellness visit, td/tdap, shingrix, DEXA, and pneumonia vaccine or post pone if patient refuse or is not a candidate     HPI:  Pt is a 88 y.o. female seen today for medical management of chronic diseases.  Pt with hx of dementia, anemia, hyperlipidemia, hypertension and mood disorder.  She has been doing well in the last month. No concerns per nursing.  She is pleasantly confused and continues to self propel down hall way.    Past Medical History:  Diagnosis Date   Elevated cholesterol    Hypertension    Past Surgical History:  Procedure Laterality Date   arthroscopic shoulder Right 2001   BASAL CELL CARCINOMA EXCISION  2008   on nose   basal joint hand replace  2000   CATARACT EXTRACTION, BILATERAL  1999   child birth  86 and 1954   x 2   CYSTECTOMY  2009   lower back   PILONIDAL CYST EXCISION  1951   TOTAL HIP ARTHROPLASTY Left 12/01   TOTAL HIP ARTHROPLASTY Right 2003    Allergies  Allergen Reactions    Etodolac Other (See Comments)    Increased BP   Tape     Outpatient Encounter Medications as of 05/02/2022  Medication Sig   acetaminophen (TYLENOL) 325 MG tablet Take 650 mg by mouth every 8 (eight) hours as needed.   acetaminophen (TYLENOL) 650 MG CR tablet Take 650 mg by mouth 3 (three) times daily.   escitalopram (LEXAPRO) 10 MG tablet Take 10 mg by mouth daily.   ferrous sulfate 220 (44 Fe) MG/5ML solution Take 220 mg by mouth daily.   hydrocortisone 2.5 % cream Apply to hemorrhoids topically three times daily as needed.   Infant Care Products [facility]) OINT Apply to sacrum, coccyx,buttocks topically as needed for redness.   loperamide (IMODIUM A-D) 2 MG tablet Take 2 mg by mouth as needed for diarrhea or loose stools.   Magnesium Hydroxide (MILK OF MAGNESIA PO) Take 30 mLs by mouth every 12 (twelve) hours as needed.   Multiple Vitamins-Minerals (MULTIVITAMINS THER. W/MINERALS) TABS tablet Take 1 tablet by mouth daily.   ondansetron (ZOFRAN) 8 MG tablet Take 8 mg by mouth every 8 (eight) hours as needed for nausea or vomiting.   polyethylene glycol (MIRALAX / GLYCOLAX) 17 g packet Take 17 g by mouth daily.   Vitamin D, Ergocalciferol, (DRISDOL) 1.25 MG (50000 UNIT) CAPS capsule Take 50,000 Units by mouth every 30 (thirty)  days.   [DISCONTINUED] lidocaine (LIDODERM) 5 % Place 1 patch onto the skin daily. Apply to interior left hip   No facility-administered encounter medications on file as of 05/02/2022.    Review of Systems  Unable to perform ROS: Dementia    Immunization History  Administered Date(s) Administered   Influenza-Unspecified 02/26/2022   MODERNA COVID-19 SARS-COV-2 PEDS BIVALENT BOOSTER 6Y-11Y 03/22/2022   Pneumococcal Polysaccharide-23 10/12/2011   Pertinent  Health Maintenance Due  Topic Date Due   DEXA SCAN  Never done   INFLUENZA VACCINE  Completed      05/12/2021    7:20 PM 12/05/2021    5:21 PM  Fall Risk  Patient Fall Risk Level High fall risk  High fall risk   Functional Status Survey:    Vitals:   05/02/22 1615  BP: 103/64  Pulse: 63  Weight: 122 lb (55.3 kg)  Height: '5\' 6"'$  (1.676 m)   Body mass index is 19.69 kg/m.  Filed Weights   05/02/22 1615  Weight: 122 lb (55.3 kg)    Physical Exam Constitutional:      General: She is not in acute distress.    Appearance: She is well-developed. She is not diaphoretic.  HENT:     Head: Normocephalic and atraumatic.     Mouth/Throat:     Pharynx: No oropharyngeal exudate.  Eyes:     Conjunctiva/sclera: Conjunctivae normal.     Pupils: Pupils are equal, round, and reactive to light.  Cardiovascular:     Rate and Rhythm: Normal rate and regular rhythm.     Heart sounds: Normal heart sounds.  Pulmonary:     Effort: Pulmonary effort is normal.     Breath sounds: Normal breath sounds.  Abdominal:     General: Bowel sounds are normal.     Palpations: Abdomen is soft.  Musculoskeletal:     Cervical back: Normal range of motion and neck supple.     Right lower leg: No edema.     Left lower leg: No edema.  Skin:    General: Skin is warm and dry.     Coloration: Skin is pale.  Neurological:     Mental Status: She is alert. Mental status is at baseline.  Psychiatric:        Mood and Affect: Mood normal.     Labs reviewed: Recent Labs    11/05/21 0000 12/05/21 1722 03/29/22 0000  NA 137 136 138  K 4.3 4.0 4.5  CL 104 101 106  CO2 28* 26 26*  GLUCOSE  --  110*  --   BUN 18 19 22*  CREATININE 0.8 0.77 0.9  CALCIUM 9.0 8.8* 9.1   Recent Labs    11/05/21 0000 12/05/21 1722 03/29/22 0000  AST '16 29 30  '$ ALT '11 18 11  '$ ALKPHOS 75 82 86  BILITOT  --  0.8  --   PROT  --  6.8  --   ALBUMIN 3.6 3.8 3.7   Recent Labs    12/05/21 1722 03/29/22 0000 04/03/22 0000  WBC 12.0* 6.9 7.0  NEUTROABS  --  4,147.00 3,836.00  HGB 9.5* 7.7* 7.9*  HCT 30.5* 26* 26*  MCV 85.0  --   --   PLT 307 389 409*   Lab Results  Component Value Date   TSH 1.78 01/14/2014    No results found for: "HGBA1C" No results found for: "CHOL", "HDL", "LDLCALC", "LDLDIRECT", "TRIG", "CHOLHDL"  Significant Diagnostic Results in last 30 days:  No results  found.  Assessment/Plan 1. Moderate late onset Alzheimer's dementia without behavioral disturbance, psychotic disturbance, mood disturbance, or anxiety (HCC) -Stable, no acute changes in cognitive or functional status, continue supportive care.   2. Chronic idiopathic constipation Controlled at this time.   3. Mood disorder ([place]) -well controlled on lexapro 10 mg daily  4. Iron deficiency anemia, unspecified iron deficiency anemia type -will follow up iron level, TIBC, CBC at this time.   5. Vitamin D deficiency Continues on VIt D supplement  6. Primary osteoarthritis, unspecified site -controlled on tylenol TID scheduled .  Carlos American. [place], [facility] [phone]

## 2022-05-03 LAB — CBC AND DIFFERENTIAL
HCT: 26 — AB (ref 36–46)
Hemoglobin: 7.6 — AB (ref 12.0–16.0)
Neutrophils Absolute: 3712
Platelets: 340 10*3/uL (ref 150–400)
WBC: 6.5

## 2022-05-03 LAB — CBC: RBC: 3.4 — AB (ref 3.87–5.11)

## 2022-05-03 LAB — IRON,TIBC AND FERRITIN PANEL
%SAT: 4
Ferritin: 14
Iron: 15
TIBC: 402

## 2022-05-09 LAB — IRON,TIBC AND FERRITIN PANEL
%SAT: 5
Ferritin: 46
Iron: 22
TIBC: 439

## 2022-05-09 LAB — CBC AND DIFFERENTIAL
HCT: 28 — AB (ref 36–46)
Hemoglobin: 8.2 — AB (ref 12.0–16.0)
Neutrophils Absolute: 4697
Platelets: 365 10*3/uL (ref 150–400)
WBC: 7.6

## 2022-05-09 LAB — CBC: RBC: 3.69 — AB (ref 3.87–5.11)

## 2022-05-09 LAB — VITAMIN D 25 HYDROXY (VIT D DEFICIENCY, FRACTURES): Vit D, 25-Hydroxy: 41

## 2022-06-13 ENCOUNTER — Encounter: Payer: Self-pay | Admitting: Nurse Practitioner

## 2022-06-13 ENCOUNTER — Non-Acute Institutional Stay (SKILLED_NURSING_FACILITY): Payer: Medicare Other | Admitting: Nurse Practitioner

## 2022-06-13 DIAGNOSIS — F39 Unspecified mood [affective] disorder: Secondary | ICD-10-CM

## 2022-06-13 DIAGNOSIS — D509 Iron deficiency anemia, unspecified: Secondary | ICD-10-CM | POA: Diagnosis not present

## 2022-06-13 DIAGNOSIS — F02B0 Dementia in other diseases classified elsewhere, moderate, without behavioral disturbance, psychotic disturbance, mood disturbance, and anxiety: Secondary | ICD-10-CM

## 2022-06-13 DIAGNOSIS — G301 Alzheimer's disease with late onset: Secondary | ICD-10-CM

## 2022-06-13 DIAGNOSIS — E559 Vitamin D deficiency, unspecified: Secondary | ICD-10-CM

## 2022-06-13 DIAGNOSIS — M1991 Primary osteoarthritis, unspecified site: Secondary | ICD-10-CM

## 2022-06-13 DIAGNOSIS — K5904 Chronic idiopathic constipation: Secondary | ICD-10-CM | POA: Diagnosis not present

## 2022-06-13 NOTE — Progress Notes (Signed)
Location:   [facility] Room Number: 316A Place of Service:  SNF [phone]) Provider:  Sherrie Mustache, NP  Dewayne Shorter, MD  Patient Care Team: Dewayne Shorter, MD as PCP - General (Family Medicine)  Extended Emergency Contact Information Primary Emergency Contact: [facility] Address: [address]          [place],  [place] Home Phone: [phone] Relation: None Secondary Emergency Contact: Remus Blake Address: [address]          [place], [place] [address] Home Phone: [phone] Relation: None  Code Status:  DNR Goals of care: Advanced Directive information    06/13/2022    3:36 PM  Advanced Directives  Does Patient Have a Medical Advance Directive? Yes  Type of Advance Directive Out of facility DNR (pink MOST or yellow form)  Does patient want to make changes to medical advance directive? No - Patient declined  Pre-existing out of facility DNR order (yellow form or pink MOST form) Yellow form placed in chart (order not valid for inpatient use)     Chief Complaint  Patient presents with   Medical Management of Chronic Issues    Routine follow up   Immunizations    Tdap, shingrix vaccine, pneumonia vaccine and a COVID booster due   Quality Metric Gaps    Dexa scan and Medicare annual wellness due    HPI:  [name] seen today for medical management of chronic diseases.   Pt at [facility], hx of dementia, htn, hyperlipidemia, anemia, OA She has been doing well. She self propels through the hall.  Occasional agitation but easily redirected.  No signs of pain.    Past Medical History:  Diagnosis Date   Elevated cholesterol    Hypertension    Past Surgical History:  Procedure Laterality Date   arthroscopic shoulder Right 2001   BASAL CELL CARCINOMA EXCISION  2008   on nose   basal joint hand replace  2000   CATARACT EXTRACTION, BILATERAL  1999   child birth  25 and 1954   x 2    CYSTECTOMY  2009   lower back   PILONIDAL CYST EXCISION  1951   TOTAL HIP ARTHROPLASTY Left 12/01   TOTAL HIP ARTHROPLASTY Right 2003    Allergies  Allergen Reactions   Etodolac Other (See Comments)    Increased BP   Tape     Allergies as of 06/13/2022       Reactions   Etodolac Other (See Comments)   Increased BP   Tape         Medication List        Accurate as of June 13, 2022  3:46 PM. If you have any questions, ask your nurse or doctor.          acetaminophen 650 MG CR tablet Commonly known as: TYLENOL Take 650 mg by mouth 3 (three) times daily.   acetaminophen 325 MG tablet Commonly known as: TYLENOL Take 650 mg by mouth every 8 (eight) hours as needed.   Dermacloud Oint Apply to sacrum, coccyx,buttocks topically as needed for redness.   escitalopram 10 MG tablet Commonly known as: LEXAPRO Take 10 mg by mouth daily.   ferrous sulfate 220 (44 Fe) MG/5ML solution Take 220 mg by mouth daily.   hydrocortisone 2.5 % cream Apply to hemorrhoids topically three times daily as needed.   loperamide 2 MG tablet Commonly known as: IMODIUM A-D Take 2  mg by mouth as needed for diarrhea or loose stools.   MILK OF MAGNESIA PO Take 30 mLs by mouth every 12 (twelve) hours as needed.   multivitamins ther. w/minerals Tabs tablet Take 1 tablet by mouth daily.   ondansetron 8 MG tablet Commonly known as: ZOFRAN Take 8 mg by mouth every 8 (eight) hours as needed for nausea or vomiting.   polyethylene glycol 17 g packet Commonly known as: MIRALAX / GLYCOLAX Take 17 g by mouth daily.   Vitamin D (Ergocalciferol) 1.25 MG (50000 UNIT) Caps capsule Commonly known as: DRISDOL Take 50,000 Units by mouth every 30 (thirty) days.        Review of Systems  Unable to perform ROS: Dementia    Immunization History  Administered Date(s) Administered   Influenza-Unspecified 02/26/2022   MODERNA COVID-19 SARS-COV-2 PEDS BIVALENT BOOSTER 6Y-11Y 03/22/2022    Pneumococcal Polysaccharide-23 10/12/2011   Pertinent  Health Maintenance Due  Topic Date Due   DEXA SCAN  Never done   INFLUENZA VACCINE  Completed      05/12/2021    7:20 PM 12/05/2021    5:21 PM  Fall Risk  (RETIRED) Patient Fall Risk Level High fall risk High fall risk   Functional Status Survey:    Vitals:   06/13/22 1533  BP: 94/61  Pulse: 83  Resp: 17  Temp: (!) 97.5 F (36.4 C)  SpO2: 96%  Weight: 124 lb 9.6 oz (56.5 kg)  Height: '5\' 6"'$  (1.676 m)   Body mass index is 20.11 kg/m. Physical Exam Constitutional:      General: She is not in acute distress.    Appearance: She is well-developed. She is not diaphoretic.  HENT:     Head: Normocephalic and atraumatic.     Mouth/Throat:     Pharynx: No oropharyngeal exudate.  Eyes:     Conjunctiva/sclera: Conjunctivae normal.     Pupils: Pupils are equal, round, and reactive to light.  Cardiovascular:     Rate and Rhythm: Normal rate and regular rhythm.     Heart sounds: Normal heart sounds.  Pulmonary:     Effort: Pulmonary effort is normal.     Breath sounds: Normal breath sounds.  Abdominal:     General: Bowel sounds are normal.     Palpations: Abdomen is soft.  Musculoskeletal:     Cervical back: Normal range of motion and neck supple.     Right lower leg: No edema.     Left lower leg: No edema.  Skin:    General: Skin is warm and dry.  Neurological:     Mental Status: She is alert. Mental status is at baseline.     Motor: Weakness present.     Gait: Gait abnormal.  Psychiatric:        Mood and Affect: Mood normal.        Cognition and Memory: Cognition is impaired. Memory is impaired. She exhibits impaired recent memory and impaired remote memory.     Labs reviewed: Recent Labs    11/05/21 0000 12/05/21 1722 03/29/22 0000  NA 137 136 138  K 4.3 4.0 4.5  CL 104 101 106  CO2 28* 26 26*  GLUCOSE  --  110*  --   BUN 18 19 22*  CREATININE 0.8 0.77 0.9  CALCIUM 9.0 8.8* 9.1   Recent Labs     11/05/21 0000 12/05/21 1722 03/29/22 0000  AST '16 29 30  '$ ALT '11 18 11  '$ ALKPHOS 75 82 86  BILITOT  --  0.8  --   PROT  --  6.8  --   ALBUMIN 3.6 3.8 3.7   Recent Labs    12/05/21 1722 03/29/22 0000 04/03/22 0000 05/03/22 0000 05/09/22 0000  WBC 12.0*   < > 7.0 6.5 7.6  NEUTROABS  --    < > 3,836.00 3,712.00 4,697.00  HGB 9.5*   < > 7.9* 7.6* 8.2*  HCT 30.5*   < > 26* 26* 28*  MCV 85.0  --   --   --   --   PLT 307   < > 409* 340 365   < > = values in this interval not displayed.   Lab Results  Component Value Date   TSH 1.78 01/14/2014   No results found for: "HGBA1C" No results found for: "CHOL", "HDL", "LDLCALC", "LDLDIRECT", "TRIG", "CHOLHDL"  Significant Diagnostic Results in last 30 days:  No results found.  Assessment/Plan 1. Iron deficiency anemia, unspecified iron deficiency anemia type -continues on supplement, will follow up iron studies and cbc at this time.   2. Chronic idiopathic constipation Controlled on current regimen  3. Mood disorder (HCC) Mood stable on lexapro  4. Moderate late onset Alzheimer's dementia without behavioral disturbance, psychotic disturbance, mood disturbance, or anxiety (HCC) -Stable, no acute changes in cognitive or functional status, continue supportive care.   5. Vitamin D deficiency -continues on supplement.   6. Primary osteoarthritis, unspecified site Controlled with scheduled tylenol   Omair Dettmer K. [place], [facility] [phone]

## 2022-06-17 LAB — IRON,TIBC AND FERRITIN PANEL
Ferritin: 34
Iron: 20

## 2022-06-17 LAB — CBC AND DIFFERENTIAL
HCT: 31 — AB (ref 36–46)
Hemoglobin: 9.7 — AB (ref 12.0–16.0)
Neutrophils Absolute: 3211
Platelets: 373 10*3/uL (ref 150–400)
WBC: 6.5

## 2022-06-17 LAB — CBC: RBC: 3.92 (ref 3.87–5.11)

## 2022-06-28 ENCOUNTER — Non-Acute Institutional Stay (SKILLED_NURSING_FACILITY): Payer: Medicare Other | Admitting: Student

## 2022-06-28 ENCOUNTER — Encounter: Payer: Self-pay | Admitting: Student

## 2022-06-28 DIAGNOSIS — F02B0 Dementia in other diseases classified elsewhere, moderate, without behavioral disturbance, psychotic disturbance, mood disturbance, and anxiety: Secondary | ICD-10-CM | POA: Diagnosis not present

## 2022-06-28 DIAGNOSIS — G301 Alzheimer's disease with late onset: Secondary | ICD-10-CM | POA: Diagnosis not present

## 2022-06-28 NOTE — Progress Notes (Unsigned)
Location:  Other [place] Room Number: [place] of Service:  SNF [phone]) Provider:  Dewayne Shorter, MD  Patient Care Team: Kristin Shorter, MD as PCP - General [facility])  Extended Emergency Contact Information Primary Emergency Contact: [facility] Address: [address]          [place]  [place] Home Phone: [phone] Relation: None Secondary Emergency Contact: Remus Blake Address: [address]          [place], [place] [address] Home Phone: [phone] Relation: None  Code Status:  DNR Goals of care: Advanced Directive information    06/28/2022   10:05 AM  Advanced Directives  Does Patient Have a Medical Advance Directive? Yes  Type of Advance Directive Out of facility DNR (pink MOST or yellow form)  Does patient want to make changes to medical advance directive? No - Patient declined     Chief Complaint  Patient presents with   Acute Visit    Behaviors.     HPI:  [name] seen today for an acute visit for increase in evening behaviors. Nursing states evening nursing staff has not been able to redirect patient. She is redirectable however during the day.   Patient is taking a nap in recliner. She is pleasant and smiles and thinks she is missing an important meeting. Reassured her she is fine to continue resting.    Past Medical History:  Diagnosis Date   Elevated cholesterol    Hypertension    Past Surgical History:  Procedure Laterality Date   arthroscopic shoulder Right 2001   BASAL CELL CARCINOMA EXCISION  2008   on nose   basal joint hand replace  2000   CATARACT EXTRACTION, BILATERAL  1999   child birth  49 and 1954   x 2   CYSTECTOMY  2009   lower back   PILONIDAL CYST EXCISION  1951   TOTAL HIP ARTHROPLASTY Left 12/01   TOTAL HIP ARTHROPLASTY Right 2003    Allergies  Allergen Reactions   Etodolac Other (See Comments)    Increased BP   Tape     Outpatient Encounter  Medications as of 06/28/2022  Medication Sig   acetaminophen (TYLENOL) 325 MG tablet Take 325 mg by mouth every 8 (eight) hours as needed.   acetaminophen (TYLENOL) 650 MG CR tablet Take 650 mg by mouth 3 (three) times daily.   escitalopram (LEXAPRO) 10 MG tablet Take 10 mg by mouth daily.   ferrous sulfate 220 (44 Fe) MG/5ML solution Take 220 mg by mouth daily.   hydrocortisone 2.5 % cream Apply to hemorrhoids topically three times daily as needed.   Infant Care Products [facility]) OINT Apply to sacrum, coccyx,buttocks topically as needed for redness.   loperamide (IMODIUM A-D) 2 MG tablet Take 2 mg by mouth as needed for diarrhea or loose stools.   Magnesium Hydroxide (MILK OF MAGNESIA PO) Take 30 mLs by mouth every 12 (twelve) hours as needed.   Multiple Vitamins-Minerals (MULTIVITAMINS THER. W/MINERALS) TABS tablet Take 1 tablet by mouth daily.   ondansetron (ZOFRAN) 8 MG tablet Take 8 mg by mouth every 8 (eight) hours as needed for nausea or vomiting.   polyethylene glycol (MIRALAX / GLYCOLAX) 17 g packet Take 17 g by mouth daily.   Vitamin D, Ergocalciferol, (DRISDOL) 1.25 MG (50000 UNIT) CAPS capsule Take 50,000 Units by mouth every 30 (thirty) days.   No facility-administered encounter medications on file as of 06/28/2022.  Review of Systems  Immunization History  Administered Date(s) Administered   Influenza-Unspecified 02/29/2020, 02/26/2021, 02/26/2022   MODERNA COVID-19 SARS-COV-2 PEDS BIVALENT BOOSTER 6Y-11Y 03/22/2022   Moderna Covid-19 Vaccine Bivalent Booster 59yr & up 10/09/2021, 03/22/2022   Moderna Sars-Covid-2 Vaccination 05/23/2019, 06/20/2019, 03/22/2020, 09/29/2020, 02/02/2021   Pneumococcal Polysaccharide-23 10/12/2011   Pertinent  Health Maintenance Due  Topic Date Due   DEXA SCAN  Never done   INFLUENZA VACCINE  Completed      05/12/2021    7:20 PM 12/05/2021    5:21 PM  Fall Risk  (RETIRED) Patient Fall Risk Level High fall risk High fall risk    Functional Status Survey:    Vitals:   06/28/22 0952  BP: 94/61  Pulse: 83  Resp: 17  Temp: (!) 97.5 F (36.4 C)  SpO2: 96%  Weight: 124 lb 9.6 oz (56.5 kg)  Height: 5' 6"$  (1.676 m)   Body mass index is 20.11 kg/m. Physical Exam Vitals reviewed.  Constitutional:      Appearance: Normal appearance.  Cardiovascular:     Rate and Rhythm: Normal rate.     Pulses: Normal pulses.  Pulmonary:     Effort: Pulmonary effort is normal.  Neurological:     Mental Status: She is alert. Mental status is at baseline. She is disoriented.     Labs reviewed: Recent Labs    11/05/21 0000 12/05/21 1722 03/29/22 0000  NA 137 136 138  K 4.3 4.0 4.5  CL 104 101 106  CO2 28* 26 26*  GLUCOSE  --  110*  --   BUN 18 19 22*  CREATININE 0.8 0.77 0.9  CALCIUM 9.0 8.8* 9.1   Recent Labs    11/05/21 0000 12/05/21 1722 03/29/22 0000  AST 16 29 30  $ ALT 11 18 11  $ ALKPHOS 75 82 86  BILITOT  --  0.8  --   PROT  --  6.8  --   ALBUMIN 3.6 3.8 3.7   Recent Labs    12/05/21 1722 03/29/22 0000 05/03/22 0000 05/09/22 0000 06/17/22 0000  WBC 12.0*   < > 6.5 7.6 6.5  NEUTROABS  --    < > 3,712.00 4,697.00 3,211.00  HGB 9.5*   < > 7.6* 8.2* 9.7*  HCT 30.5*   < > 26* 28* 31*  MCV 85.0  --   --   --   --   PLT 307   < > 340 365 373   < > = values in this interval not displayed.   Lab Results  Component Value Date   TSH 1.78 01/14/2014   No results found for: "HGBA1C" No results found for: "CHOL", "HDL", "LDLCALC", "LDLDIRECT", "TRIG", "CHOLHDL"  Significant Diagnostic Results in last 30 days:  No results found.  Assessment/Plan Moderate late onset Alzheimer's dementia without behavioral disturbance, psychotic disturbance, mood disturbance, or anxiety ([facility] - Plan: traZODone (DESYREL) 50 MG tablet Patient with history of dementia. Likely having sundowning due to underlying dementia. Will trial nightly trazodone and determine whether or not this is sufficient or if she should  have additional symptom management.    Family/ staff Communication: nursing  Labs/tests ordered:  none

## 2022-06-30 DIAGNOSIS — H25019 Cortical age-related cataract, unspecified eye: Secondary | ICD-10-CM | POA: Insufficient documentation

## 2022-06-30 DIAGNOSIS — M1990 Unspecified osteoarthritis, unspecified site: Secondary | ICD-10-CM | POA: Insufficient documentation

## 2022-06-30 DIAGNOSIS — M545 Low back pain, unspecified: Secondary | ICD-10-CM | POA: Insufficient documentation

## 2022-06-30 DIAGNOSIS — E785 Hyperlipidemia, unspecified: Secondary | ICD-10-CM | POA: Insufficient documentation

## 2022-06-30 DIAGNOSIS — D126 Benign neoplasm of colon, unspecified: Secondary | ICD-10-CM | POA: Insufficient documentation

## 2022-06-30 DIAGNOSIS — M48061 Spinal stenosis, lumbar region without neurogenic claudication: Secondary | ICD-10-CM | POA: Insufficient documentation

## 2022-06-30 HISTORY — DX: Hyperlipidemia, unspecified: E78.5

## 2022-06-30 MED ORDER — TRAZODONE HCL 50 MG PO TABS: 50.0000 mg | ORAL_TABLET | Freq: Every day | ORAL | 3 refills | Status: DC

## 2022-07-22 ENCOUNTER — Non-Acute Institutional Stay (SKILLED_NURSING_FACILITY): Payer: Medicare Other | Admitting: Adult Health

## 2022-07-22 ENCOUNTER — Encounter: Payer: Self-pay | Admitting: Adult Health

## 2022-07-22 DIAGNOSIS — M25552 Pain in left hip: Secondary | ICD-10-CM

## 2022-07-22 MED ORDER — TRAMADOL HCL 50 MG PO TABS: 50.0000 mg | ORAL_TABLET | Freq: Two times a day (BID) | ORAL | 0 refills | Status: DC | PRN

## 2022-07-22 NOTE — Progress Notes (Signed)
Location:  Other [facility].  [facility] Room Number: [place] of Service:  SNF [phone]) Provider:  Durenda Age, NP  PCP: Dewayne Shorter, MD  Patient Care Team: Dewayne Shorter, MD as PCP - General [facility])  Extended Emergency Contact Information Primary Emergency Contact: [facility] Address: [address]          [place]  [place] Home Phone: [phone] Relation: None Secondary Emergency Contact: Remus Blake Address: [address]          [place], [place] [address] Home Phone: [phone] Relation: None  Code Status:  DNR Goals of care: Advanced Directive information    07/22/2022   10:58 AM  Advanced Directives  Does Patient Have a Medical Advance Directive? Yes  Type of Advance Directive Out of facility DNR (pink MOST or yellow form)  Does patient want to make changes to medical advance directive? No - Patient declined     Chief Complaint  Patient presents with   Acute Visit    Pain Management     HPI:  [name] seen today for an acute visit for pain management. She complains of left hip pain whenever she moves. She takes Tylenol PRN but pain is not relieved. No edema nor erythema noted on left hip. Staff reported that resident lays on her left hip for a long time and might be causing the pain. She has a PMH of hyperlipidemia and hypertension.   Past Medical History:  Diagnosis Date   Elevated cholesterol    Hypertension    Past Surgical History:  Procedure Laterality Date   arthroscopic shoulder Right 2001   BASAL CELL CARCINOMA EXCISION  2008   on nose   basal joint hand replace  2000   CATARACT EXTRACTION, BILATERAL  1999   child birth  66 and 1954   x 2   CYSTECTOMY  2009   lower back   PILONIDAL CYST EXCISION  1951   TOTAL HIP ARTHROPLASTY Left 12/01   TOTAL HIP ARTHROPLASTY Right 2003    Allergies  Allergen Reactions   Etodolac Other (See Comments)    Increased BP   Tape      Outpatient Encounter Medications as of 07/22/2022  Medication Sig   acetaminophen (TYLENOL) 325 MG tablet Take 325 mg by mouth every 8 (eight) hours as needed.   acetaminophen (TYLENOL) 650 MG CR tablet Take 650 mg by mouth 3 (three) times daily.   escitalopram (LEXAPRO) 10 MG tablet Take 10 mg by mouth daily.   ferrous sulfate 220 (44 Fe) MG/5ML solution Take 220 mg by mouth daily.   hydrocortisone 2.5 % cream Apply to hemorrhoids topically three times daily as needed.   Infant Care Products [facility]) OINT Apply to sacrum, coccyx,buttocks topically as needed for redness.   loperamide (IMODIUM A-D) 2 MG tablet Take 2 mg by mouth as needed for diarrhea or loose stools.   Magnesium Hydroxide (MILK OF MAGNESIA PO) Take 30 mLs by mouth every 12 (twelve) hours as needed.   Multiple Vitamins-Minerals (MULTIVITAMINS THER. W/MINERALS) TABS tablet Take 1 tablet by mouth daily.   ondansetron (ZOFRAN) 8 MG tablet Take 8 mg by mouth every 8 (eight) hours as needed for nausea or vomiting.   polyethylene glycol (MIRALAX / GLYCOLAX) 17 g packet Take 17 g by mouth daily.   traMADol (ULTRAM) 50 MG tablet Take 1 tablet (50 mg total) by mouth 2 (two) times daily as needed.   traZODone (DESYREL) 50 MG  tablet Take 1 tablet (50 mg total) by mouth at bedtime.   Vitamin D, Ergocalciferol, (DRISDOL) 1.25 MG (50000 UNIT) CAPS capsule Take 50,000 Units by mouth every 30 (thirty) days.   No facility-administered encounter medications on file as of 07/22/2022.    Review of Systems  Unable to obtain due to dementia.  Immunization History  Administered Date(s) Administered   Influenza-Unspecified 02/29/2020, 02/26/2021, 02/26/2022   MODERNA COVID-19 SARS-COV-2 PEDS BIVALENT BOOSTER 6Y-11Y 03/22/2022   Moderna Covid-19 Vaccine Bivalent Booster 14yrs & up 10/09/2021, 03/22/2022   Moderna Sars-Covid-2 Vaccination 05/23/2019, 06/20/2019, 03/22/2020, 09/29/2020, 02/02/2021   Pneumococcal Polysaccharide-23 10/12/2011    Pertinent  Health Maintenance Due  Topic Date Due   DEXA SCAN  Never done   INFLUENZA VACCINE  Completed      05/12/2021    7:20 PM 12/05/2021    5:21 PM  Fall Risk  (RETIRED) Patient Fall Risk Level High fall risk High fall risk   Functional Status Survey:    Vitals:   07/22/22 1053 07/22/22 1432  BP: (!) 142/82 94/61  Pulse: 77   Resp: 19   Temp: (!) 97.2 F (36.2 C)   SpO2: 99%   Weight: 128 lb (58.1 kg)   Height: 5\' 6"  (1.676 m)    Body mass index is 20.66 kg/m. Physical Exam Constitutional:      Appearance: Normal appearance.  HENT:     Head: Normocephalic and atraumatic.     Nose: Nose normal.     Mouth/Throat:     Mouth: Mucous membranes are moist.  Eyes:     Conjunctiva/sclera: Conjunctivae normal.  Cardiovascular:     Rate and Rhythm: Normal rate and regular rhythm.  Pulmonary:     Effort: Pulmonary effort is normal.     Breath sounds: Normal breath sounds.  Abdominal:     General: Bowel sounds are normal.     Palpations: Abdomen is soft.  Musculoskeletal:        General: Normal range of motion.     Cervical back: Normal range of motion.  Skin:    General: Skin is warm and dry.  Neurological:     Mental Status: She is alert. Mental status is at baseline.     Comments: Alert to self, disoriented to time and place.  Psychiatric:        Mood and Affect: Mood normal.        Behavior: Behavior normal.     Labs reviewed: Recent Labs    11/05/21 0000 12/05/21 1722 03/29/22 0000  NA 137 136 138  K 4.3 4.0 4.5  CL 104 101 106  CO2 28* 26 26*  GLUCOSE  --  110*  --   BUN 18 19 22*  CREATININE 0.8 0.77 0.9  CALCIUM 9.0 8.8* 9.1   Recent Labs    11/05/21 0000 12/05/21 1722 03/29/22 0000  AST 16 29 30   ALT 11 18 11   ALKPHOS 75 82 86  BILITOT  --  0.8  --   PROT  --  6.8  --   ALBUMIN 3.6 3.8 3.7   Recent Labs    12/05/21 1722 03/29/22 0000 05/03/22 0000 05/09/22 0000 06/17/22 0000  WBC 12.0*   < > 6.5 7.6 6.5  NEUTROABS  --     < > 3,712.00 4,697.00 3,211.00  HGB 9.5*   < > 7.6* 8.2* 9.7*  HCT 30.5*   < > 26* 28* 31*  MCV 85.0  --   --   --   --  PLT 307   < > 340 365 373   < > = values in this interval not displayed.   Lab Results  Component Value Date   TSH 1.78 01/14/2014   No results found for: "HGBA1C" No results found for: "CHOL", "HDL", "LDLCALC", "LDLDIRECT", "TRIG", "CHOLHDL"  Significant Diagnostic Results in last 30 days:  No results found.  Assessment/Plan 1. Acute hip pain, left -  will start on Tramadol 50 mg BID PRN -  encouraged to turn to side when in bed to relieve pressure -  monitor for skin impairment   Family/ staff Communication:   Discussed plan of care with charge nurse.  Labs/tests ordered:  None

## 2022-08-13 ENCOUNTER — Non-Acute Institutional Stay (SKILLED_NURSING_FACILITY): Payer: Medicare Other | Admitting: Nurse Practitioner

## 2022-08-13 ENCOUNTER — Encounter: Payer: Self-pay | Admitting: Nurse Practitioner

## 2022-08-13 DIAGNOSIS — M25552 Pain in left hip: Secondary | ICD-10-CM | POA: Diagnosis not present

## 2022-08-13 DIAGNOSIS — K5904 Chronic idiopathic constipation: Secondary | ICD-10-CM | POA: Diagnosis not present

## 2022-08-13 DIAGNOSIS — D509 Iron deficiency anemia, unspecified: Secondary | ICD-10-CM

## 2022-08-13 DIAGNOSIS — G301 Alzheimer's disease with late onset: Secondary | ICD-10-CM

## 2022-08-13 DIAGNOSIS — F02B0 Dementia in other diseases classified elsewhere, moderate, without behavioral disturbance, psychotic disturbance, mood disturbance, and anxiety: Secondary | ICD-10-CM

## 2022-08-13 DIAGNOSIS — E559 Vitamin D deficiency, unspecified: Secondary | ICD-10-CM

## 2022-08-13 DIAGNOSIS — F39 Unspecified mood [affective] disorder: Secondary | ICD-10-CM

## 2022-08-13 NOTE — Progress Notes (Signed)
Careteam: Patient Care Team: Dewayne Shorter, MD as PCP - General (Family Medicine)  PLACE OF SERVICE:  [facility] Advanced Directive information Does Patient Have a Medical Advance Directive?: Yes, Type of Advance Directive: Out of facility DNR (pink MOST or yellow form), Does patient want to make changes to medical advance directive?: No - Patient declined  Allergies  Allergen Reactions   Etodolac Other (See Comments)    Increased BP   Tape     Chief Complaint  Patient presents with   Medical Management of Chronic Issues    Medical Management of Chronic Issues.      HPI: Patient is a 89 y.o. female for routine follow up plus acute visit due to more yelling out. Staff reports last week she was occasionally yelling out and complained of pain to leg intermediately. This week she has started to yell out more constantly due to pain. Points to her mid thigh on left leg. She does not extend her legs and reports she can not.  She has been sleeping on her right side only.  She has been getting tramadol to help with pain but does not seem to be very beneficial.  No injury noted.   Review of Systems:  Review of Systems  Unable to perform ROS: Dementia    Past Medical History:  Diagnosis Date   Elevated cholesterol    Hypertension    Past Surgical History:  Procedure Laterality Date   arthroscopic shoulder Right 2001   BASAL CELL CARCINOMA EXCISION  2008   on nose   basal joint hand replace  2000   CATARACT EXTRACTION, BILATERAL  1999   child birth  74 and 1954   x 2   CYSTECTOMY  2009   lower back   [place] ARTHROPLASTY Left 12/01   TOTAL HIP ARTHROPLASTY Right 2003   Social History:   reports that she has quit smoking. She has never used smokeless tobacco. She reports that she does not currently use alcohol. She reports that she does not use drugs.  History reviewed. No pertinent family  history.  Medications: Patient's Medications  New Prescriptions   No medications on file  Previous Medications   ACETAMINOPHEN (TYLENOL) 325 MG TABLET    Take 325 mg by mouth every 8 (eight) hours as needed.   ACETAMINOPHEN (TYLENOL) 650 MG CR TABLET    Take 650 mg by mouth 3 (three) times daily.   ESCITALOPRAM (LEXAPRO) 10 MG TABLET    Take 10 mg by mouth daily.   FERROUS SULFATE 220 (44 FE) MG/5ML SOLUTION    Take 220 mg by mouth daily.   HYDROCORTISONE 2.5 % CREAM    Apply to hemorrhoids topically three times daily as needed.   INFANT CARE PRODUCTS (DERMACLOUD) OINT    Apply to sacrum, coccyx,buttocks topically as needed for redness.   LOPERAMIDE (IMODIUM A-D) 2 MG TABLET    Take 2 mg by mouth as needed for diarrhea or loose stools.   MAGNESIUM HYDROXIDE (MILK OF MAGNESIA PO)    Take 30 mLs by mouth every 12 (twelve) hours as needed.   MULTIPLE VITAMINS-MINERALS (MULTIVITAMINS THER. W/MINERALS) TABS TABLET    Take 1 tablet by mouth daily.   ONDANSETRON (ZOFRAN) 8 MG TABLET    Take 8 mg by mouth every 8 (eight) hours as needed for nausea or vomiting.   POLYETHYLENE GLYCOL (MIRALAX / GLYCOLAX) 17 G PACKET    Take 17  g by mouth daily.   TRAMADOL (ULTRAM) 50 MG TABLET    Take 1 tablet (50 mg total) by mouth 2 (two) times daily as needed.   TRAZODONE (DESYREL) 50 MG TABLET    Take 1 tablet (50 mg total) by mouth at bedtime.   VITAMIN D, ERGOCALCIFEROL, (DRISDOL) 1.25 MG (50000 UNIT) CAPS CAPSULE    Take 50,000 Units by mouth every 30 (thirty) days.  Modified Medications   No medications on file  Discontinued Medications   No medications on file    Physical Exam:  Vitals:   08/13/22 1437 08/13/22 1445  BP: (!) 153/81 (!) 142/82  Pulse: 93   Resp: 20   Temp: (!) 97.4 F (36.3 C)   SpO2: 94%   Weight: 128 lb 9.6 oz (58.3 kg)   Height: 5\' 6"  (1.676 m)    Body mass index is 20.76 kg/m. Wt Readings from Last 3 Encounters:  08/13/22 128 lb 9.6 oz (58.3 kg)  07/22/22 128 lb (58.1  kg)  06/28/22 124 lb 9.6 oz (56.5 kg)    Physical Exam Constitutional:      General: She is not in acute distress.    Appearance: She is well-developed. She is not diaphoretic.  HENT:     Head: Normocephalic and atraumatic.     Mouth/Throat:     Pharynx: No oropharyngeal exudate.  Eyes:     Conjunctiva/sclera: Conjunctivae normal.     Pupils: Pupils are equal, round, and reactive to light.  Cardiovascular:     Rate and Rhythm: Normal rate and regular rhythm.     Heart sounds: Normal heart sounds.  Pulmonary:     Effort: Pulmonary effort is normal.     Breath sounds: Normal breath sounds.  Abdominal:     General: Bowel sounds are normal.     Palpations: Abdomen is soft.  Musculoskeletal:        General: Tenderness present.     Cervical back: Normal range of motion and neck supple.     Right hip: Decreased range of motion.     Left hip: Tenderness present. Decreased range of motion.     Left upper leg: Tenderness present.     Right lower leg: No edema.     Left lower leg: No edema.  Skin:    General: Skin is warm and dry.  Neurological:     Mental Status: She is alert. She is disoriented.     Gait: Gait abnormal.  Psychiatric:        Mood and Affect: Mood normal.     Labs reviewed: Basic Metabolic Panel: Recent Labs    11/05/21 0000 12/05/21 1722 03/29/22 0000  NA 137 136 138  K 4.3 4.0 4.5  CL 104 101 106  CO2 28* 26 26*  GLUCOSE  --  110*  --   BUN 18 19 22*  CREATININE 0.8 0.77 0.9  CALCIUM 9.0 8.8* 9.1   Liver Function Tests: Recent Labs    11/05/21 0000 12/05/21 1722 03/29/22 0000  AST 16 29 30   ALT 11 18 11   ALKPHOS 75 82 86  BILITOT  --  0.8  --   PROT  --  6.8  --   ALBUMIN 3.6 3.8 3.7   Recent Labs    12/05/21 1722  LIPASE 35   No results for input(s): "AMMONIA" in the last 8760 hours. CBC: Recent Labs    12/05/21 1722 03/29/22 0000 05/03/22 0000 05/09/22 0000 06/17/22 0000  WBC 12.0*   < >  6.5 7.6 6.5  NEUTROABS  --    < >  3,712.00 4,697.00 3,211.00  HGB 9.5*   < > 7.6* 8.2* 9.7*  HCT 30.5*   < > 26* 28* 31*  MCV 85.0  --   --   --   --   PLT 307   < > 340 365 373   < > = values in this interval not displayed.   Lipid Panel: No results for input(s): "CHOL", "HDL", "LDLCALC", "TRIG", "CHOLHDL", "LDLDIRECT" in the last 8760 hours. TSH: No results for input(s): "TSH" in the last 8760 hours. A1C: No results found for: "HGBA1C"   Assessment/Plan 1. Chronic idiopathic constipation -controlled on current regimen.   2. Moderate late onset Alzheimer's dementia without behavioral disturbance, psychotic disturbance, mood disturbance, or anxiety -Stable, no acute changes in cognitive or functional status, continue supportive care with staff.  3. Iron deficiency anemia, unspecified iron deficiency anemia type -continues on supplement, will follow up cbc  4. Mood disorder -continues on lexapro. Overall mood has been controlled.   5. Vitamin D deficiency -will follow up vit d level  6. Acute hip pain, left Worsening pain of left upper leg and hip. She refuses to extend left leg and will not allow movement of left hip. Tramadol not helping pain.  No injury reported, no swelling or bruising noted.  Will get xray of left pelvis and hip -likely related to severe OA, will start prednisone taper.    Carlos American. [place] [phone]

## 2022-08-14 ENCOUNTER — Encounter: Payer: Self-pay | Admitting: *Deleted

## 2022-08-14 ENCOUNTER — Encounter
Admission: EM | Disposition: A | Discharge status: Skilled Nursing Facility | Payer: Self-pay | Source: Home / Self Care | Attending: Emergency Medicine

## 2022-08-14 ENCOUNTER — Ambulatory Visit
Admission: EM | Admit: 2022-08-14 | Discharge: 2022-08-14 | Disposition: A | Discharge status: Skilled Nursing Facility | Payer: Medicare Other | Attending: Orthopedic Surgery | Admitting: Orthopedic Surgery

## 2022-08-14 ENCOUNTER — Emergency Department: Payer: Medicare Other

## 2022-08-14 ENCOUNTER — Other Ambulatory Visit: Payer: Self-pay

## 2022-08-14 ENCOUNTER — Emergency Department: Payer: Medicare Other | Admitting: Anesthesiology

## 2022-08-14 ENCOUNTER — Other Ambulatory Visit: Payer: Self-pay | Admitting: Student

## 2022-08-14 DIAGNOSIS — Z96641 Presence of right artificial hip joint: Secondary | ICD-10-CM | POA: Insufficient documentation

## 2022-08-14 DIAGNOSIS — T84021A Dislocation of internal left hip prosthesis, initial encounter: Secondary | ICD-10-CM

## 2022-08-14 DIAGNOSIS — I10 Essential (primary) hypertension: Secondary | ICD-10-CM | POA: Diagnosis not present

## 2022-08-14 DIAGNOSIS — Z85828 Personal history of other malignant neoplasm of skin: Secondary | ICD-10-CM | POA: Diagnosis not present

## 2022-08-14 DIAGNOSIS — Y792 Prosthetic and other implants, materials and accessory orthopedic devices associated with adverse incidents: Secondary | ICD-10-CM | POA: Diagnosis not present

## 2022-08-14 DIAGNOSIS — M25552 Pain in left hip: Secondary | ICD-10-CM

## 2022-08-14 DIAGNOSIS — Z87891 Personal history of nicotine dependence: Secondary | ICD-10-CM | POA: Insufficient documentation

## 2022-08-14 DIAGNOSIS — E7800 Pure hypercholesterolemia, unspecified: Secondary | ICD-10-CM | POA: Insufficient documentation

## 2022-08-14 DIAGNOSIS — F02C0 Dementia in other diseases classified elsewhere, severe, without behavioral disturbance, psychotic disturbance, mood disturbance, and anxiety: Secondary | ICD-10-CM | POA: Diagnosis not present

## 2022-08-14 HISTORY — PX: HIP CLOSED REDUCTION: SHX983

## 2022-08-14 LAB — BASIC METABOLIC PANEL
Anion gap: 9 (ref 5–15)
BUN: 25 mg/dL — ABNORMAL HIGH (ref 8–23)
CO2: 24 mmol/L (ref 22–32)
Calcium: 9.3 mg/dL (ref 8.9–10.3)
Chloride: 103 mmol/L (ref 98–111)
Creatinine, Ser: 0.79 mg/dL (ref 0.44–1.00)
GFR, Estimated: >60 mL/min (ref >60)
Glucose, Bld: 124 mg/dL — ABNORMAL HIGH (ref 70–99)
Potassium: 4.8 mmol/L (ref 3.5–5.1)
Sodium: 136 mmol/L (ref 135–145)

## 2022-08-14 LAB — CBC
HCT: 36 % (ref 36.0–46.0)
Hemoglobin: 11.3 g/dL — ABNORMAL LOW (ref 12.0–15.0)
MCH: 27.2 pg (ref 26.0–34.0)
MCHC: 31.4 g/dL (ref 30.0–36.0)
MCV: 86.7 fL (ref 80.0–100.0)
Platelets: 369 10*3/uL (ref 150–400)
RBC: 4.15 MIL/uL (ref 3.87–5.11)
RDW: 15.8 % — ABNORMAL HIGH (ref 11.5–15.5)
WBC: 8.2 10*3/uL (ref 4.0–10.5)
nRBC: 0 % (ref 0.0–0.2)

## 2022-08-14 SURGERY — CLOSED REDUCTION, HIP
Anesthesia: General | Site: Hip | Laterality: Left

## 2022-08-14 MED ORDER — PROPOFOL 10 MG/ML IV BOLUS
20.0000 mg | INTRAVENOUS | Status: DC | PRN
  Administered 2022-08-14: 20 mg via INTRAVENOUS
  Filled 2022-08-14: qty 20

## 2022-08-14 MED ORDER — OXYCODONE HCL 5 MG PO TABS
ORAL_TABLET | ORAL | Status: AC
  Filled 2022-08-14: qty 1

## 2022-08-14 MED ORDER — ROCURONIUM BROMIDE 100 MG/10ML IV SOLN
INTRAVENOUS | Status: DC | PRN
  Administered 2022-08-14: 40 mg via INTRAVENOUS

## 2022-08-14 MED ORDER — SUGAMMADEX SODIUM 200 MG/2ML IV SOLN
INTRAVENOUS | Status: DC | PRN
  Administered 2022-08-14: 200 mg via INTRAVENOUS

## 2022-08-14 MED ORDER — ACETAMINOPHEN 10 MG/ML IV SOLN: 1000.0000 mg | Freq: Once | INTRAVENOUS | Status: DC | PRN

## 2022-08-14 MED ORDER — SUCCINYLCHOLINE CHLORIDE 200 MG/10ML IV SOSY
PREFILLED_SYRINGE | INTRAVENOUS | Status: DC | PRN
  Administered 2022-08-14: 40 mg via INTRAVENOUS

## 2022-08-14 MED ORDER — OXYCODONE HCL 5 MG PO TABS
5.0000 mg | ORAL_TABLET | Freq: Once | ORAL | Status: AC | PRN
  Administered 2022-08-14: 5 mg via ORAL

## 2022-08-14 MED ORDER — PROPOFOL 1000 MG/100ML IV EMUL
INTRAVENOUS | Status: AC
  Filled 2022-08-14: qty 100

## 2022-08-14 MED ORDER — FENTANYL CITRATE PF 50 MCG/ML IJ SOSY
50.0000 ug | PREFILLED_SYRINGE | Freq: Once | INTRAMUSCULAR | Status: AC
  Administered 2022-08-14: 50 ug via INTRAVENOUS
  Filled 2022-08-14: qty 1

## 2022-08-14 MED ORDER — DROPERIDOL 2.5 MG/ML IJ SOLN: 0.6250 mg | Freq: Once | INTRAMUSCULAR | Status: DC | PRN

## 2022-08-14 MED ORDER — FENTANYL CITRATE (PF) 100 MCG/2ML IJ SOLN
INTRAMUSCULAR | Status: AC
  Filled 2022-08-14: qty 2

## 2022-08-14 MED ORDER — OXYCODONE HCL 5 MG PO TABS
5.0000 mg | ORAL_TABLET | ORAL | 0 refills | Status: DC | PRN
Start: 2022-08-14 — End: 2022-08-16

## 2022-08-14 MED ORDER — FENTANYL CITRATE PF 50 MCG/ML IJ SOSY
25.0000 ug | PREFILLED_SYRINGE | INTRAMUSCULAR | Status: DC | PRN
  Administered 2022-08-14: 25 ug via INTRAVENOUS
  Filled 2022-08-14: qty 1

## 2022-08-14 MED ORDER — PROPOFOL 500 MG/50ML IV EMUL
INTRAVENOUS | Status: DC | PRN
  Administered 2022-08-14: 100 ug/kg/min via INTRAVENOUS

## 2022-08-14 MED ORDER — SODIUM CHLORIDE 0.9 % IV BOLUS
500.0000 mL | Freq: Once | INTRAVENOUS | Status: AC | PRN
  Administered 2022-08-14: 500 mL via INTRAVENOUS

## 2022-08-14 MED ORDER — PROMETHAZINE HCL 25 MG/ML IJ SOLN: 6.2500 mg | INTRAMUSCULAR | Status: DC | PRN

## 2022-08-14 MED ORDER — FENTANYL CITRATE PF 50 MCG/ML IJ SOSY
25.0000 ug | PREFILLED_SYRINGE | Freq: Once | INTRAMUSCULAR | Status: AC
  Administered 2022-08-14: 25 ug via INTRAVENOUS

## 2022-08-14 MED ORDER — OXYCODONE HCL 5 MG/5ML PO SOLN: 5.0000 mg | Freq: Once | ORAL | Status: AC | PRN

## 2022-08-14 MED ORDER — LACTATED RINGERS IV SOLN: INTRAVENOUS | Status: DC | PRN

## 2022-08-14 MED ORDER — FENTANYL CITRATE (PF) 100 MCG/2ML IJ SOLN
INTRAMUSCULAR | Status: DC | PRN
  Administered 2022-08-14: 50 ug via INTRAVENOUS
  Administered 2022-08-14: 25 ug via INTRAVENOUS

## 2022-08-14 MED ORDER — PROPOFOL 10 MG/ML IV BOLUS
INTRAVENOUS | Status: AC | PRN
  Administered 2022-08-14 (×3): 20 mg via INTRAVENOUS

## 2022-08-14 MED ORDER — FENTANYL CITRATE (PF) 100 MCG/2ML IJ SOLN: 25.0000 ug | INTRAMUSCULAR | Status: DC | PRN

## 2022-08-14 MED ORDER — PROPOFOL 10 MG/ML IV BOLUS
INTRAVENOUS | Status: DC | PRN
  Administered 2022-08-14 (×2): 30 mg via INTRAVENOUS

## 2022-08-14 SURGICAL SUPPLY — 2 items
IMMOB KNEE 14 THIGH 24 706614 (SOFTGOODS) IMPLANT
KIT TURNOVER KIT A (KITS) ×1 IMPLANT

## 2022-08-14 NOTE — Progress Notes (Signed)
EMS transported pt to [facility].  Provided all necessary documents required for transport.  Pt VSS.  Safety maintained.

## 2022-08-14 NOTE — Progress Notes (Signed)
Patient with ED visit for dislocation. Increased pain. Will give oxycodone for 3 days PRN instead of tramadol.

## 2022-08-14 NOTE — ED Notes (Addendum)
This RN and MD attempted to contact family with no answer. This RN contacting [facility] to attempt to get more info

## 2022-08-14 NOTE — Transfer of Care (Signed)
Immediate Anesthesia Transfer of Care Note  Patient: Peggye Ley  Procedure(s) Performed: CLOSED REDUCTION HIP (Left: Hip)  Patient Location: PACU  Anesthesia Type:General  Level of Consciousness: drowsy  Airway & Oxygen Therapy: Patient Spontanous Breathing  Post-op Assessment: Report given to RN and Post -op Vital signs reviewed and stable  Post vital signs: Reviewed and stable  Last Vitals:  Vitals Value Taken Time  BP 182/102 08/14/22 1500  Temp 36.2 C 08/14/22 1452  Pulse 95 08/14/22 1501  Resp 22 08/14/22 1501  SpO2 96 % 08/14/22 1501  Vitals shown include unvalidated device data.  Last Pain:  Vitals:   08/14/22 1338  TempSrc: Temporal         Complications: No notable events documented.

## 2022-08-14 NOTE — ED Notes (Signed)
This RN in contact with Daughter Izora Gala, Dr Jacelyn Grip speaking to daughter regarding procedure.

## 2022-08-14 NOTE — H&P (Signed)
ORTHOPAEDIC CONSULTATION  REQUESTING PHYSICIAN: No att. providers found  Chief Complaint:   L hip pain  History of Present Illness: History obtained from review of the medical record, discussion with medical staff, and discussion with the patient's daughter over the phone, who is medical power of attorney.  Patient's daughter lives in [place].  Kristin Woods is a 89 y.o. female with a history of severe dementia who presented to the emergency department with a periprosthetic dislocation of the left hip.  The patient has had a few falls a few weeks ago, but is not certain if she had immediate hip pain after 1 of these falls.  She was evaluated on 07/22/2022 at her nursing home for left hip pain, but radiographs were not obtained.  Repeat radiographs at her nursing home yesterday showed a periprosthetic hip dislocation.  She was transferred to our emergency department.  She underwent attempted closed reduction by the ED staff, but this was not successful.  Hip replacement was performed approximately 20 years ago in [place], but unknown as to which hospital/surgeon.  She has not had any notable issues with her hip replacement until now.  Of note, she has also had contralateral right hip arthroplasty without complication.  Per the patient's daughter, the patient has severe dementia and does not recognize her daughter and does not remember events from previously in the day.  She had been ambulatory with a walker, but more recently was mostly in a wheelchair.     Past Medical History:  Diagnosis Date   Elevated cholesterol    Hypertension    Past Surgical History:  Procedure Laterality Date   arthroscopic shoulder Right 2001   BASAL CELL CARCINOMA EXCISION  2008   on nose   basal joint hand replace  2000   CATARACT EXTRACTION, BILATERAL  1999   child birth  41 and 1954   x 2   CYSTECTOMY  2009   lower back   [place] ARTHROPLASTY Left 12/01   TOTAL HIP ARTHROPLASTY Right 2003   Social History   Socioeconomic History   Marital status: Married    Spouse name: Not on file   Number of children: Not on file   Years of education: Not on file   Highest education level: Not on file  Occupational History   Not on file  Tobacco Use   Smoking status: Former   Smokeless tobacco: Never   Tobacco comments:    quit 1956  Vaping Use   Vaping Use: Never used  Substance and Sexual Activity   Alcohol use: Not Currently    Comment: social    Drug use: No   Sexual activity: Not on file  Other Topics Concern   Not on file  Social History Narrative   Not on file   Social Determinants of Health   Financial Resource Strain: Not on file  Food Insecurity: Not on file  Transportation Needs: Not on file  Physical Activity: Not on file  Stress: Not on file  Social Connections: Not on file   History reviewed. No pertinent family history. Allergies  Allergen Reactions   Etodolac Other (See Comments)    Increased BP   Tape    Prior to Admission medications   Medication Sig Start Date End Date Taking? Authorizing Provider  acetaminophen (TYLENOL) 325 MG tablet Take 325 mg by mouth every 8 (eight) hours as needed.    [provider]  acetaminophen (TYLENOL) 650 MG CR tablet Take 650 mg by mouth 3 (three) times daily.    [provider]  escitalopram (LEXAPRO) 10 MG tablet Take 10 mg by mouth daily.    [provider]  ferrous sulfate 220 (44 Fe) MG/5ML solution Take 220 mg by mouth daily.    [provider]  hydrocortisone 2.5 % cream Apply to hemorrhoids topically three times daily as needed.    [provider]  [place]) OINT Apply to sacrum, coccyx,buttocks topically as needed for redness.    [provider]  loperamide (IMODIUM A-D) 2 MG tablet Take 2 mg by mouth as needed for diarrhea or loose  stools.    [provider]  Magnesium Hydroxide (MILK OF MAGNESIA PO) Take 30 mLs by mouth every 12 (twelve) hours as needed.    [provider]  Multiple Vitamins-Minerals (MULTIVITAMINS THER. W/MINERALS) TABS tablet Take 1 tablet by mouth daily.    [provider]  ondansetron (ZOFRAN) 8 MG tablet Take 8 mg by mouth every 8 (eight) hours as needed for nausea or vomiting.    [provider]  polyethylene glycol (MIRALAX / GLYCOLAX) 17 g packet Take 17 g by mouth daily.    [provider]  traMADol (ULTRAM) 50 MG tablet Take 1 tablet (50 mg total) by mouth 2 (two) times daily as needed. 07/22/22   Medina-Vargas, Monina C, NP  traZODone (DESYREL) 50 MG tablet Take 1 tablet (50 mg total) by mouth at bedtime. 06/30/22   Dewayne Shorter, MD  Vitamin D, Ergocalciferol, (DRISDOL) 1.25 MG (50000 UNIT) CAPS capsule Take 50,000 Units by mouth every 30 (thirty) days.    [provider]   Recent Labs    08/14/22 0850  WBC 8.2  HGB 11.3*  HCT 36.0  PLT 369  K 4.8  CL 103  CO2 24  BUN 25*  CREATININE 0.79  GLUCOSE 124*  CALCIUM 9.3   DG Pelvis Portable  Result Date: 08/14/2022 CLINICAL DATA:  Post reduction. EXAM: PORTABLE PELVIS 1-2 VIEWS COMPARISON:  Left hip x-rays from same day. FINDINGS: The prosthetic left femoral head remains dislocated. IMPRESSION: 1. Persistent dislocation of the prosthetic left femoral head. Electronically Signed   By: Titus Dubin M.D.   On: 08/14/2022 11:35   DG Hip Unilat W or Wo Pelvis 2-3 Views Left  Result Date: 08/14/2022 CLINICAL DATA:  Pain.  Left hip dislocation. EXAM: DG HIP (WITH OR WITHOUT PELVIS) 2-3V LEFT COMPARISON:  04/14/2000 FINDINGS: Dislocation of the prosthetic femoral head on the left relative to the prosthetic acetabulum, in the superior direction. No evidence of regional fracture. Right hip replacement appears unremarkable. IMPRESSION: Superior dislocation of the prosthetic left femoral head.  Electronically Signed   By: Nelson Chimes M.D.   On: 08/14/2022 09:20     Positive ROS: All other systems have been reviewed and were otherwise negative with the exception of those mentioned in the HPI and as above.  Physical Exam: BP (!) 181/84   Pulse 90   Temp 97.6 F (36.4 C) (Temporal)   Resp 20   Wt 63.5 kg   SpO2 97%   BMI 22.60 kg/m  General:  Alert, no acute distress Psychiatric:  Patient is competent for consent with normal mood and affect   Cardiovascular:  No pedal edema, regular rate and rhythm Respiratory:  No wheezing, non-labored breathing GI:  Abdomen is soft and non-tender Skin:  No lesions in the area of chief complaint,  no erythema Neurologic:  Sensation intact distally, CN grossly intact Lymphatic:  No axillary or cervical lymphadenopathy  Orthopedic Exam:  LLE: + DF/PF/EHL SILT grossly over foot Foot wwp Leg shortened and internally rotated.  Severe pain with any motion No notable bruising or erythema  X-rays:  As above: L posterior periprosthetic hip dislocation  Assessment/Plan: Kristin Woods is a 89 y.o. female with a L displaced posterior periprosthetic hip dislocation  1. I discussed the various treatment options including both surgical and non-surgical management of the fracture with the patient and/or family (medical PoA). We discussed the high risk of perioperative complications due to patient's age and dementia. After discussion of risks, benefits, and alternatives to surgery, the family and/or patient were in agreement to proceed with surgery in the form of attempted closed reduction of the left hip  2. NPO until OR 3.  I did discuss with the daughter that if close reduction was unsuccessful, which could be a possibility given that injury may be over 95 weeks old, we would likely discuss possible open reduction and poly exchange.  I would likely refer to my partner Dr. Karel Jarvis for further consideration of risks and benefits of this  procedure.    Leim Fabry   08/14/2022 2:07 PM

## 2022-08-14 NOTE — Discharge Instructions (Signed)
Discharge Instructions   1. Activity: May resume normal activity, but MUST keep hip abduction pillow or knee immobilizer on AT ALL TIMES. Otherwise, hip may dislocate.   2. Hip precautions: Must maintain hip precautions to avoid dislocation. These include 1) No hip flexion > 90 degrees, 2) No hip adduction (do not let hip cross over midline of body), 3) No hip internal rotation (avoid bringing knee past midline of body with foot turned out).    3. Medications: Can resume baseline medications.    4. Post-Op Appointments: Can follow up with [facility] in 1-2 weeks if needed. Call [phone] to make appointment.

## 2022-08-14 NOTE — Op Note (Signed)
Operative Note    SURGERY DATE: 08/14/2022    PRE-OP DIAGNOSIS:  1. L prosthetic hip dislocation   POST-OP DIAGNOSIS:  1. L prosthetic hip dislocation   PROCEDURE(S): 1. Closed reduction of L hip   SURGEON: Cato Mulligan, MD    ANESTHESIA: Gen   ESTIMATED BLOOD LOSS: none   TOTAL IV FLUIDS: none  INDICATION(S):  Kristin Woods is a 89 y.o. female with L prosthetic hip dislocation with unknown duration. This appears to be first hip dislocation after initial surgery in 2001. After discussion of risks, benefits, and alternatives to the procedure, the patient's daughter (medical PoA) elected to proceed.    OPERATIVE FINDINGS: L hip dislocation   OPERATIVE REPORT:   The patient was seen in the Holding Room. Informed consent was obtained from family. The site of surgery was properly noted/marked. The patient was taken to Operating Room. A Time Out was held and the patient identity, procedure, and laterality was confirmed. After administration of adequate anesthesia, reduction maneuver consisting of hip flexion, adduction, and rotation was performed. This did not allow for reduction. [name] maneuver was performed. A palpable clunk was felt as the hip reduced. X-rays were obtained to confirm reduction. Of note, hip was taken through range of motion and was felt to re-dislocate with hip flexion, adduction, and IR. Hip was reduced again. Fluoroscopy was used to confirm reduction. A hip abduction pillow was placed. The patient was awakened from anesthesia without any further complication and transferred to PACU for further recovery.     POST-OPERATIVE PLAN:  Patient will have posterior hip precautions. WBAT with hip abduction pillow or knee immobilizer. Immobilizer or abduction pillow must be on at ALL times.

## 2022-08-14 NOTE — ED Triage Notes (Signed)
Pt to ED [facility] from [facility] for left hip dislocation. Ems reports pt has had pain for last 2 weeks, got worse the past few days. Denies falls

## 2022-08-14 NOTE — Anesthesia Preprocedure Evaluation (Signed)
Anesthesia Evaluation  Patient identified by MRN, date of birth, ID band Patient awake    Reviewed: Allergy & Precautions, H&P , NPO status , Patient's Chart, lab work & pertinent test results, reviewed documented beta blocker date and time   Airway Mallampati: II   Neck ROM: full    Dental  (+) Poor Dentition   Pulmonary neg pulmonary ROS, former smoker   Pulmonary exam normal        Cardiovascular Exercise Tolerance: Poor hypertension, On Medications negative cardio ROS Normal cardiovascular exam Rhythm:regular Rate:Normal     Neuro/Psych negative neurological ROS  negative psych ROS   GI/Hepatic negative GI ROS, Neg liver ROS,,,  Endo/Other  negative endocrine ROS    Renal/GU negative Renal ROS  negative genitourinary   Musculoskeletal   Abdominal   Peds  Hematology negative hematology ROS (+)   Anesthesia Other Findings Past Medical History: No date: Elevated cholesterol No date: Hypertension Past Surgical History: 2001: arthroscopic shoulder; Right 2008: BASAL CELL CARCINOMA EXCISION     Comment:  on nose 2000: basal joint hand replace 1999: CATARACT EXTRACTION, BILATERAL 1953 and 1956: child birth     Comment:  x 2 2009: CYSTECTOMY     Comment:  lower back 1951: PILONIDAL CYST EXCISION 12/01: TOTAL HIP ARTHROPLASTY; Left 2003: TOTAL HIP ARTHROPLASTY; Right BMI    Body Mass Index: 22.60 kg/m     Reproductive/Obstetrics negative OB ROS                             Anesthesia Physical Anesthesia Plan  ASA: 3 and emergent  Anesthesia Plan: General   Post-op Pain Management:    Induction:   PONV Risk Score and Plan: 4 or greater  Airway Management Planned:   Additional Equipment:   Intra-op Plan:   Post-operative Plan:   Informed Consent: I have reviewed the patients History and Physical, chart, labs and discussed the procedure including the risks, benefits  and alternatives for the proposed anesthesia with the patient or authorized representative who has indicated his/her understanding and acceptance.     Dental Advisory Given  Plan Discussed with: CRNA  Anesthesia Plan Comments:        Anesthesia Quick Evaluation

## 2022-08-14 NOTE — Progress Notes (Signed)
Pt returning to [facility].  Gave verbal report to [facility].  Sent AVS in packet with EMS

## 2022-08-14 NOTE — ED Notes (Signed)
Pt frequently yelling, c/o pain

## 2022-08-14 NOTE — ED Notes (Signed)
Pt unable to sign consent, disoriented. Consent verbalized with daughter nancy, verified with Casey Burkitt

## 2022-08-14 NOTE — ED Provider Notes (Addendum)
[facility] Provider Note    Event Date/Time   First MD Initiated Contact with Patient 08/14/22 0848     (approximate)   History   Hip Pain   HPI  Kristin Woods is a 89 y.o. female   Past medical history of Alzheimer's dementia, iron deficiency anemia, who presents with left hip pain.  This has been ongoing for several weeks.  No reported inciting event or trauma or fall.  She comes from a nursing home.  She understands the situation and that she is in the emergency department for hip pain but is disoriented to time and does not recall ever having her bilateral hips replaced in the past.  She is status post hip replacement bilaterally.  Offers no other acute medical complaints.  External Medical Documents Reviewed: Internal medicine progress note from 07/22/2022 as well as a geriatric medicine note from yesterday for her left hip pain, I reviewed an x-ray report dated 08/13/2022 which shows a dislocated left hip.      Physical Exam   Triage Vital Signs: ED Triage Vitals  Enc Vitals Group     BP      Pulse      Resp      Temp      Temp src      SpO2      Weight      Height      Head Circumference      Peak Flow      Pain Score      Pain Loc      Pain Edu?      Excl. in [place]?     Most recent vital signs: Vitals:   08/14/22 1100 08/14/22 1104  BP: (!) 159/81 (!) 166/98  Pulse: 80 78  Resp: 20 17  Temp:    SpO2: 100% 100%    General: Awake, no distress.  CV:  Good peripheral perfusion.  Resp:  Normal effort.  Abd:  No distention.  Other:  She has her left hip flexed and internally rotated, the leg appears warm and well-perfused, she has strong pedal pulses and sensation intact throughout.  There is no tenderness to palpation or deformity of the knee or other joints in the left lower extremity.  She otherwise appears comfortable no other signs of obvious trauma noted   ED Results / Procedures / Treatments   Labs (all labs ordered  are listed, but only abnormal results are displayed) Labs Reviewed  CBC - Abnormal; Notable for the following components:      Result Value   Hemoglobin 11.3 (*)    RDW 15.8 (*)    All other components within normal limits  BASIC METABOLIC PANEL - Abnormal; Notable for the following components:   Glucose, Bld 124 (*)    BUN 25 (*)    All other components within normal limits     I ordered and reviewed the above labs they are notable for Hgb 11.3, normal white blood cell count  EKG  ED ECG REPORT I, Lucillie Garfinkel, the attending physician, personally viewed and interpreted this ECG.   Date: 08/14/2022  EKG Time: 0854  Rate: 82  Rhythm: sinus  Axis: LAD  Intervals: occasional PVC  ST&T Change: No acute ischemic changes    RADIOLOGY I independently reviewed and interpreted hip x-ray and see dislocated left hip   PROCEDURES:  Critical Care performed: No  .Sedation  Date/Time: 08/14/2022 10:57 AM  Performed by: Lucillie Garfinkel,  MD Authorized by: Lucillie Garfinkel, MD   Consent:    Consent obtained:  Verbal   Consent given by:  Patient (daughter Izora Gala)   Risks discussed:  Allergic reaction, prolonged hypoxia resulting in organ damage, dysrhythmia, respiratory compromise necessitating ventilatory assistance and intubation, inadequate sedation, nausea and vomiting   Alternatives discussed:  Analgesia without sedation and anxiolysis Universal protocol:    Immediately prior to procedure, a time out was called: yes   Indications:    Procedure performed:  Dislocation reduction   Procedure necessitating sedation performed by:  Physician performing sedation Pre-sedation assessment:    Time since last food or drink:  Morning   ASA classification: class 2 - patient with mild systemic disease     Mouth opening:  3 or more finger widths   Thyromental distance:  3 finger widths   Mallampati score:  I - soft palate, uvula, fauces, pillars visible   Neck mobility: normal     Pre-sedation  assessments completed and reviewed: airway patency, cardiovascular function, hydration status, mental status, nausea/vomiting (Morning), pain level and respiratory function   Immediate pre-procedure details:    Reassessment: Patient reassessed immediately prior to procedure   Procedure details (see MAR for exact dosages):    Preoxygenation:  Nasal cannula   Sedation:  Propofol   Intended level of sedation: deep and moderate (conscious sedation)   Analgesia:  Fentanyl   Intra-procedure monitoring:  Blood pressure monitoring, cardiac monitor, continuous capnometry, continuous pulse oximetry, frequent LOC assessments and frequent vital sign checks   Intra-procedure events: none     Total Provider sedation time (minutes):  20 Post-procedure details:    Attendance: Constant attendance by certified staff until patient recovered     Recovery: Patient returned to pre-procedure baseline     Post-sedation assessments completed and reviewed: airway patency, cardiovascular function, hydration status, mental status, nausea/vomiting, pain level, respiratory function and temperature     Patient is stable for discharge or admission: yes     Procedure completion:  Tolerated well, no immediate complications Reduction of dislocation  Date/Time: 08/14/2022 10:59 AM  Performed by: Lucillie Garfinkel, MD Authorized by: Lucillie Garfinkel, MD  Consent: Verbal consent obtained. Written consent obtained. Risks and benefits: risks, benefits and alternatives were discussed Consent given by: patient and guardian Imaging studies: imaging studies available Patient identity confirmed: arm band Time out: Immediately prior to procedure a "time out" was called to verify the correct patient, procedure, equipment, support staff and site/side marked as required.  Sedation: Patient sedated: yes Sedatives: propofol Analgesia: fentanyl Vitals: Vital signs were monitored during sedation.  Patient tolerance: patient tolerated the  procedure well with no immediate complications      MEDICATIONS ORDERED IN ED: Medications  propofol (DIPRIVAN) 10 mg/mL bolus/IV push 20 mg (20 mg Intravenous Given 08/14/22 1041)  fentaNYL (SUBLIMAZE) injection 25 mcg (25 mcg Intravenous Given 08/14/22 1041)  sodium chloride 0.9 % bolus 500 mL (500 mLs Intravenous New Bag/Given 08/14/22 1042)  propofol (DIPRIVAN) 10 mg/mL bolus/IV push (20 mg Intravenous Given 08/14/22 1049)  fentaNYL (SUBLIMAZE) injection 25 mcg (25 mcg Intravenous Given 08/14/22 1055)  fentaNYL (SUBLIMAZE) injection 50 mcg (50 mcg Intravenous Given 08/14/22 1110)     IMPRESSION / MDM / ASSESSMENT AND PLAN / ED COURSE  I reviewed the triage vital signs and the nursing notes.  Patient's presentation is most consistent with acute presentation with potential threat to life or bodily function.  Differential diagnosis includes, but is not limited to, hip fracture, hip dislocation, septic joint   The patient is on the cardiac monitor to evaluate for evidence of arrhythmia and/or significant heart rate changes.  MDM: Patient with subacute left hip pain, reportedly atraumatic, and has an outside x-ray report of dislocation.  There are no overlying signs of infection swelling warmth or erythema and she is afebrile so I doubt this is a septic joint.  She is neurovascularly intact.  Will obtain basic labs and an x-ray to confirm the diagnosis here before proceeding with sedation and reduction.  I tried to call her contact person for consent but received no answer.  Our nurse was able to track down her daughter, Izora Gala, who I spoke with over the phone to give consent for procedural sedation and reduction.   See sedation and reduction note in the procedure section above.  Patient tolerated well.  Adequate sedation, cardiopulmonary monitoring, complicated by a very brief period of apnea prior to reduction but recovered quickly as the leg was manipulated.   Repeat x-ray is pending, though the patient continues to have deformity of the left hip. Indeed the hip remains dislocated.  Patient has fully recovered from sedation.  I consulted with Dr. Posey Pronto of orthopedics who plans to take her to the operating room for reduction.          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    Lucillie Garfinkel, MD 08/14/22 1101    Lucillie Garfinkel, MD 08/14/22 [phone]

## 2022-08-15 ENCOUNTER — Encounter: Payer: Self-pay | Admitting: Orthopedic Surgery

## 2022-08-15 LAB — COMPREHENSIVE METABOLIC PANEL
Albumin: 3.4 — AB (ref 3.5–5.0)
Calcium: 9.4 (ref 8.7–10.7)
Globulin: 2.6
eGFR: 67

## 2022-08-15 LAB — CBC AND DIFFERENTIAL
HCT: 34 — AB (ref 36–46)
Hemoglobin: 10.7 — AB (ref 12.0–16.0)
Neutrophils Absolute: 5127
Platelets: 369 10*3/uL (ref 150–400)
WBC: 6.9

## 2022-08-15 LAB — BASIC METABOLIC PANEL
BUN: 23 — AB (ref 4–21)
CO2: 27 — AB (ref 13–22)
Chloride: 102 (ref 99–108)
Creatinine: 0.8 (ref 0.5–1.1)
Glucose: 129
Potassium: 4.6 mEq/L (ref 3.5–5.1)
Sodium: 137 (ref 137–147)

## 2022-08-15 LAB — CBC: RBC: 3.9 (ref 3.87–5.11)

## 2022-08-15 LAB — HEPATIC FUNCTION PANEL
ALT: 11 U/L (ref 7–35)
AST: 22 (ref 13–35)
Alkaline Phosphatase: 66 (ref 25–125)
Bilirubin, Total: 0.3

## 2022-08-15 LAB — VITAMIN D 25 HYDROXY (VIT D DEFICIENCY, FRACTURES): Vit D, 25-Hydroxy: 25

## 2022-08-15 NOTE — Anesthesia Postprocedure Evaluation (Signed)
Anesthesia Post Note  Patient: Kristin Woods  Procedure(s) Performed: CLOSED REDUCTION HIP (Left: Hip)  Patient location during evaluation: PACU Anesthesia Type: General Level of consciousness: awake and alert Pain management: pain level controlled Vital Signs Assessment: post-procedure vital signs reviewed and stable Respiratory status: spontaneous breathing, nonlabored ventilation, respiratory function stable and patient connected to nasal cannula oxygen Cardiovascular status: blood pressure returned to baseline and stable Postop Assessment: no apparent nausea or vomiting Anesthetic complications: no   No notable events documented.   Last Vitals:  Vitals:   08/14/22 1600 08/14/22 1619  BP: (!) 175/89 (!) 178/92  Pulse: 89 93  Resp: 19 18  Temp:  (!) 36.4 C  SpO2: 95% 94%    Last Pain:  Vitals:   08/14/22 1619  TempSrc: Temporal  PainSc: 0-No pain                 Molli Barrows

## 2022-08-16 ENCOUNTER — Encounter: Payer: Self-pay | Admitting: Student

## 2022-08-16 ENCOUNTER — Non-Acute Institutional Stay (SKILLED_NURSING_FACILITY): Payer: Medicare Other | Admitting: Student

## 2022-08-16 DIAGNOSIS — Z96642 Presence of left artificial hip joint: Secondary | ICD-10-CM | POA: Diagnosis not present

## 2022-08-16 DIAGNOSIS — F02B0 Dementia in other diseases classified elsewhere, moderate, without behavioral disturbance, psychotic disturbance, mood disturbance, and anxiety: Secondary | ICD-10-CM

## 2022-08-16 DIAGNOSIS — Z515 Encounter for palliative care: Secondary | ICD-10-CM

## 2022-08-16 DIAGNOSIS — G301 Alzheimer's disease with late onset: Secondary | ICD-10-CM | POA: Diagnosis not present

## 2022-08-16 DIAGNOSIS — K5904 Chronic idiopathic constipation: Secondary | ICD-10-CM | POA: Diagnosis not present

## 2022-08-16 DIAGNOSIS — S73005D Unspecified dislocation of left hip, subsequent encounter: Secondary | ICD-10-CM

## 2022-08-16 MED ORDER — SENNA 8.6 MG PO TABS
2.0000 | ORAL_TABLET | Freq: Every day | ORAL | 0 refills | Status: DC
Start: 2022-08-16 — End: 2022-11-07

## 2022-08-16 MED ORDER — OXYCODONE HCL 5 MG PO TABS: 5.0000 mg | ORAL_TABLET | Freq: Four times a day (QID) | ORAL | 0 refills | Status: AC | PRN

## 2022-08-16 NOTE — Progress Notes (Signed)
Location:  Other [place].  Nursing Home Room Number: [facility] [address] Place of Service:  SNF [phone]) Provider:  Earnestine Mealing, MD  Patient Care Team: Earnestine Mealing, MD as PCP - General (Family Medicine)  Extended Emergency Contact Information Primary Emergency Contact: [facility] Phone: [phone] Relation: Daughter Preferred language: English Interpreter needed? No Secondary Emergency Contact: [facility] Mobile Phone: [phone] Relation: Son  Code Status:  DNR Goals of care: Advanced Directive information    08/16/2022    9:00 AM  Advanced Directives  Does Patient Have a Medical Advance Directive? Yes  Type of Advance Directive Out of facility DNR (pink MOST or yellow form)  Does patient want to make changes to medical advance directive? No - Patient declined     Chief Complaint  Patient presents with   Acute Visit    ED Follow up    HPI:  [name] seen today for an acute visit for emergency room follow up. Patient was sent to the emergency department after x-ray in the facility showed dislocation.  Patient is in bed resting comfortable in bed. She is oriented to self, however, unable to describe location. She is smiling and pleasant with answering questions albeit inaccurately. She answers "I don't know" to location. She knows she has a daughter "Harriett Sine." She says, "I would like to get out of bed." "My bottom does hurt, but other than that I am fine."  Spoke with nursing, they have been hesitant to move her around due to the knee abductor brace placed by orthopedic surgery.   Called patient's daughter to discuss concerns. Patient's hip could continue to come out of place, and surgery is not in the patient's best interest. She would prefer to continue to support her with adequate pain control, but no surgical interventions. Discussed the importance of quality of life over quantity of days and if the goal is for comfort, it may be most  appropriate to move forward with a consultation to hospice/palliative care.    Past Medical History:  Diagnosis Date   Elevated cholesterol    Hypertension    Past Surgical History:  Procedure Laterality Date   arthroscopic shoulder Right 2001   BASAL CELL CARCINOMA EXCISION  2008   on nose   basal joint hand replace  2000   CATARACT EXTRACTION, BILATERAL  1999   child birth  40 and 1954   x 2   CYSTECTOMY  2009   lower back   HIP CLOSED REDUCTION Left 08/14/2022   Procedure: CLOSED REDUCTION HIP;  Surgeon: Signa Kell, MD;  Location: [facility] ORS;  Service: Orthopedics;  Laterality: Left;   PILONIDAL CYST EXCISION  1951   TOTAL HIP ARTHROPLASTY Left 12/01   TOTAL HIP ARTHROPLASTY Right 2003    Allergies  Allergen Reactions   Etodolac Other (See Comments)    Increased BP   Tape     Facility-Administered Encounter Medications as of 08/16/2022  Medication   droperidol (INAPSINE) 2.5 MG/ML injection 0.625 mg   [MAR Hold] fentaNYL (SUBLIMAZE) injection 25 mcg   fentaNYL (SUBLIMAZE) injection 25 mcg   promethazine (PHENERGAN) injection 6.25-12.5 mg   [MAR Hold] propofol (DIPRIVAN) 10 mg/mL bolus/IV push 20 mg   Outpatient Encounter Medications as of 08/16/2022  Medication Sig   acetaminophen (TYLENOL) 325 MG tablet Take 325 mg by mouth every 8 (eight) hours as needed.   acetaminophen (TYLENOL) 650 MG CR tablet Take 650 mg by mouth 3 (three) times daily.   escitalopram (  LEXAPRO) 10 MG tablet Take 10 mg by mouth daily.   ferrous sulfate 220 (44 Fe) MG/5ML solution Take 220 mg by mouth daily.   hydrocortisone 2.5 % cream Apply to hemorrhoids topically three times daily as needed.   Infant Care Products [facility]) OINT Apply to sacrum, coccyx,buttocks topically as needed for redness.   loperamide (IMODIUM A-D) 2 MG tablet Take 2 mg by mouth as needed for diarrhea or loose stools.   Magnesium Hydroxide (MILK OF MAGNESIA PO) Take 30 mLs by mouth every 12 (twelve) hours as needed.    Multiple Vitamins-Minerals (MULTIVITAMINS THER. W/MINERALS) TABS tablet Take 1 tablet by mouth daily.   ondansetron (ZOFRAN) 8 MG tablet Take 8 mg by mouth every 8 (eight) hours as needed for nausea or vomiting.   oxyCODONE (ROXICODONE) 5 MG immediate release tablet Take 1 tablet (5 mg total) by mouth every 4 (four) hours as needed for up to 3 days for severe pain.   polyethylene glycol (MIRALAX / GLYCOLAX) 17 g packet Take 17 g by mouth daily.   predniSONE (DELTASONE) 20 MG tablet Give Two tablets by mouth at bedtime until 08/17/22. Give One tablet by mouth at bedtime until 08/19/22   traZODone (DESYREL) 50 MG tablet Take 1 tablet (50 mg total) by mouth at bedtime.   Vitamin D, Ergocalciferol, (DRISDOL) 1.25 MG (50000 UNIT) CAPS capsule Take 50,000 Units by mouth every 30 (thirty) days.   [DISCONTINUED] traMADol (ULTRAM) 50 MG tablet Take 1 tablet (50 mg total) by mouth 2 (two) times daily as needed.    Review of Systems  Immunization History  Administered Date(s) Administered   Influenza-Unspecified 02/29/2020, 02/26/2021, 02/26/2022   MODERNA COVID-19 SARS-COV-2 PEDS BIVALENT BOOSTER 6Y-11Y 03/22/2022   Moderna Covid-19 Vaccine Bivalent Booster 46yrs & up 10/09/2021, 03/22/2022   Moderna Sars-Covid-2 Vaccination 05/23/2019, 06/20/2019, 03/22/2020, 09/29/2020, 02/02/2021   Pneumococcal Polysaccharide-23 10/12/2011   Pertinent  Health Maintenance Due  Topic Date Due   DEXA SCAN  Never done   INFLUENZA VACCINE  12/12/2022      05/12/2021    7:20 PM 12/05/2021    5:21 PM  Fall Risk  (RETIRED) Patient Fall Risk Level High fall risk High fall risk   Functional Status Survey:    Vitals:   08/16/22 0852  BP: 132/76  Pulse: 84  Resp: 16  Temp: 97.8 F (36.6 C)  SpO2: 94%  Weight: 128 lb 9.6 oz (58.3 kg)  Height: 5\' 6"  (1.676 m)   Body mass index is 20.76 kg/m. Physical Exam Constitutional:      Comments: Chronically ill-appearing, bilateral temporal wasting  Cardiovascular:      Rate and Rhythm: Normal rate and regular rhythm.     Pulses: Normal pulses.  Pulmonary:     Effort: Pulmonary effort is normal.  Musculoskeletal:     Comments: Left leg with inward rotation. Adductor immobilizer in place  Neurological:     Mental Status: She is alert. Mental status is at baseline. She is disoriented.     Labs reviewed: Recent Labs    12/05/21 1722 03/29/22 0000 08/14/22 0850  NA 136 138 136  K 4.0 4.5 4.8  CL 101 106 103  CO2 26 26* 24  GLUCOSE 110*  --  124*  BUN 19 22* 25*  CREATININE 0.77 0.9 0.79  CALCIUM 8.8* 9.1 9.3   Recent Labs    11/05/21 0000 12/05/21 1722 03/29/22 0000  AST 16 29 30   ALT 11 18 11   ALKPHOS 75 82 86  BILITOT  --  0.8  --   PROT  --  6.8  --   ALBUMIN 3.6 3.8 3.7   Recent Labs    12/05/21 1722 03/29/22 0000 05/03/22 0000 05/09/22 0000 06/17/22 0000 08/14/22 0850  WBC 12.0*   < > 6.5 7.6 6.5 8.2  NEUTROABS  --    < > 3,712.00 4,697.00 3,211.00  --   HGB 9.5*   < > 7.6* 8.2* 9.7* 11.3*  HCT 30.5*   < > 26* 28* 31* 36.0  MCV 85.0  --   --   --   --  86.7  PLT 307   < > 340 365 373 369   < > = values in this interval not displayed.   Lab Results  Component Value Date   TSH 1.78 01/14/2014   No results found for: "HGBA1C" No results found for: "CHOL", "HDL", "LDLCALC", "LDLDIRECT", "TRIG", "CHOLHDL"  Significant Diagnostic Results in last 30 days:  DG HIP UNILAT WITH PELVIS 2-3 VIEWS LEFT  Result Date: 08/14/2022 CLINICAL DATA:  Status post reduction of dislocated left hip prosthesis EXAM: DG HIP (WITH OR WITHOUT PELVIS) 2-3V LEFT COMPARISON:  Study done earlier today FINDINGS: There is interval reduction of dislocated prosthetic left hip. Fluoroscopy time 4 seconds. Radiation dose 0.7 mGy. IMPRESSION: Fluoroscopic assistance was provided for reduction of dislocated prosthetic left hip. Electronically Signed   By: Ernie AvenaPalani  Rathinasamy M.D.   On: 08/14/2022 14:56   DG C-Arm 1-60 Min-No Report  Result Date:  08/14/2022 Fluoroscopy was utilized by the requesting physician.  No radiographic interpretation.   DG Pelvis Portable  Result Date: 08/14/2022 CLINICAL DATA:  Post reduction. EXAM: PORTABLE PELVIS 1-2 VIEWS COMPARISON:  Left hip x-rays from same day. FINDINGS: The prosthetic left femoral head remains dislocated. IMPRESSION: 1. Persistent dislocation of the prosthetic left femoral head. Electronically Signed   By: Obie DredgeWilliam T Derry M.D.   On: 08/14/2022 11:35   DG Hip Unilat W or Wo Pelvis 2-3 Views Left  Result Date: 08/14/2022 CLINICAL DATA:  Pain.  Left hip dislocation. EXAM: DG HIP (WITH OR WITHOUT PELVIS) 2-3V LEFT COMPARISON:  04/14/2000 FINDINGS: Dislocation of the prosthetic femoral head on the left relative to the prosthetic acetabulum, in the superior direction. No evidence of regional fracture. Right hip replacement appears unremarkable. IMPRESSION: Superior dislocation of the prosthetic left femoral head. Electronically Signed   By: Paulina FusiMark  Shogry M.D.   On: 08/14/2022 09:20    Assessment/Plan 1. Status post left hip replacement 6. Closed dislocation of left hip, subsequent encounter Patient with ED visit for evaluation of hip dislocation. She has a history of bilateral hip replacements. Multiple attempts and given the age of the hip, there has likely been chronic instability in the joint. Patient did not have a fall to have hip slip out of place-- she has been using a standing lift to aid with transfers and due to increasing weakness this likely occurred. Discussed concerns with HCPOA that patient has had decline in memory and surgical intervention may be difficult for her to recover from. She does not desire surgical interventions in the future. Will plan to continue aiding in maintaining patient's comfort and quality of life with hospice consultation. Continue oxycodone 5 mg q6 hours PRN for pain. Discontinue tramadol. The abductor is in place and at this time is not aiding in overall goal of  comfort. Will continue to evaluate for options to help with maintaining patient's comfort.   2. Moderate late onset Alzheimer's dementia without  behavioral disturbance, psychotic disturbance, mood disturbance, or anxiety Patient has had progression of dementia. She is alert and communicative with nonsensical responses. She is disoriented. Weight is stable, however she has had changes in eating patterns. Will continue supportive care. Continue Lexapro 10 mg for mood. Continue trazodone 50 mg nightly for sleep.   3. Chronic idiopathic constipation Patient with chronic constipation. Given extent of her hip pain advanced pain control to oxycodone 5 mg q6 hours. Will Continue daily miralax and start nightly senna 2 tablets to help prevent complications with constipation.   5. Hospice care patient Based on goals of care and patient's status, the plan will be to transition to hospice level of care.      Family/ staff Communication: Nursing, Harriett SineNancy  Labs/tests ordered:  None   I spent 30 minutes for the care of this patient with 7 minutes discussing goals of care with patient's daughter and HCPOA.

## 2022-08-27 ENCOUNTER — Other Ambulatory Visit: Payer: Self-pay | Admitting: Nurse Practitioner

## 2022-08-27 DIAGNOSIS — M25552 Pain in left hip: Secondary | ICD-10-CM

## 2022-08-27 MED ORDER — TRAMADOL HCL 50 MG PO TABS: ORAL_TABLET | ORAL | 0 refills | Status: DC

## 2022-09-02 ENCOUNTER — Other Ambulatory Visit: Payer: Self-pay | Admitting: Student

## 2022-09-02 DIAGNOSIS — M25552 Pain in left hip: Secondary | ICD-10-CM

## 2022-09-02 MED ORDER — TRAMADOL HCL 50 MG PO TABS
50.0000 mg | ORAL_TABLET | Freq: Three times a day (TID) | ORAL | 0 refills | Status: DC
Start: 2022-09-02 — End: 2022-09-03

## 2022-09-02 NOTE — Progress Notes (Signed)
Refill of chronic medication.

## 2022-09-03 ENCOUNTER — Other Ambulatory Visit: Payer: Self-pay | Admitting: Nurse Practitioner

## 2022-09-03 DIAGNOSIS — M25552 Pain in left hip: Secondary | ICD-10-CM

## 2022-09-03 MED ORDER — TRAMADOL HCL 50 MG PO TABS: 50.0000 mg | ORAL_TABLET | Freq: Three times a day (TID) | ORAL | 0 refills | Status: DC

## 2022-09-04 ENCOUNTER — Non-Acute Institutional Stay (SKILLED_NURSING_FACILITY): Payer: Medicare Other | Admitting: Student

## 2022-09-04 ENCOUNTER — Encounter: Payer: Self-pay | Admitting: Student

## 2022-09-04 DIAGNOSIS — S73005D Unspecified dislocation of left hip, subsequent encounter: Secondary | ICD-10-CM | POA: Diagnosis not present

## 2022-09-04 NOTE — Progress Notes (Signed)
Location:  Other [place].  Nursing Home Room Number: [facility] [address] Place of Service:  SNF [phone]) Provider:  Earnestine Mealing, MD  Patient Care Team: Earnestine Mealing, MD as PCP - General (Family Medicine)  Extended Emergency Contact Information Primary Emergency Contact: [facility] Phone: [phone] Relation: Daughter Preferred language: English Interpreter needed? No Secondary Emergency Contact: [facility] Mobile Phone: [phone] Relation: Son  Code Status:  DNR Goals of care: Advanced Directive information    09/04/2022    1:14 PM  Advanced Directives  Does Patient Have a Medical Advance Directive? Yes  Type of Estate agent of [place];Out of facility DNR (pink MOST or yellow form)  Does patient want to make changes to medical advance directive? No - Patient declined  Copy of Healthcare Power of Attorney in Chart? No - copy requested     Chief Complaint  Patient presents with   Acute Visit    Hip Dislocation.     HPI:  [name] seen today for an acute visit for hip dislocation. Patient with 89 year old hip replacement and has dislocation without injury. Per nursing she has primarily been in bed secondary to pain.   Patient is pleasant in bed and drinking juice for breakfast. She is oriented to self, however, unable to give place or time. She states she has no pain at this time. Looking at pictures of family she cannot identify herself or children accurately.   Unable to reach family members to continue GOC conversations.    Past Medical History:  Diagnosis Date   Elevated cholesterol    Hypertension    Past Surgical History:  Procedure Laterality Date   arthroscopic shoulder Right 2001   BASAL CELL CARCINOMA EXCISION  2008   on nose   basal joint hand replace  2000   CATARACT EXTRACTION, BILATERAL  1999   child birth  87 and 1954   x 2   CYSTECTOMY  2009   lower back   HIP CLOSED REDUCTION Left  08/14/2022   Procedure: CLOSED REDUCTION HIP;  Surgeon: Signa Kell, MD;  Location: [facility] ORS;  Service: Orthopedics;  Laterality: Left;   PILONIDAL CYST EXCISION  1951   TOTAL HIP ARTHROPLASTY Left 12/01   TOTAL HIP ARTHROPLASTY Right 2003    Allergies  Allergen Reactions   Etodolac Other (See Comments)    Increased BP   Tape     Outpatient Encounter Medications as of 09/04/2022  Medication Sig   acetaminophen (TYLENOL) 325 MG tablet Take 325 mg by mouth every 8 (eight) hours as needed.   acetaminophen (TYLENOL) 650 MG CR tablet Take 650 mg by mouth 3 (three) times daily.   escitalopram (LEXAPRO) 10 MG tablet Take 10 mg by mouth daily.   ferrous sulfate 220 (44 Fe) MG/5ML solution Take 220 mg by mouth daily.   hydrocortisone 2.5 % cream Apply to hemorrhoids topically three times daily as needed.   Infant Care Products [employer]) OINT Apply to sacrum, coccyx,buttocks topically as needed for redness.   loperamide (IMODIUM A-D) 2 MG tablet Take 2 mg by mouth as needed for diarrhea or loose stools.   Magnesium Hydroxide (MILK OF MAGNESIA PO) Take 30 mLs by mouth every 12 (twelve) hours as needed.   Multiple Vitamins-Minerals (MULTIVITAMINS THER. W/MINERALS) TABS tablet Take 1 tablet by mouth daily.   ondansetron (ZOFRAN) 8 MG tablet Take 8 mg by mouth every 8 (eight) hours as needed for nausea or vomiting.   polyethylene glycol (  MIRALAX / GLYCOLAX) 17 g packet Take 17 g by mouth daily.   senna (SENOKOT) 8.6 MG TABS tablet Take 2 tablets (17.2 mg total) by mouth at bedtime.   traMADol (ULTRAM) 50 MG tablet Take 1 tablet (50 mg total) by mouth in the morning, at noon, and at bedtime. 1 tablet by mouth TID scheduled, may have additional tablet every 8 hours as needed pain   traZODone (DESYREL) 50 MG tablet Take 1 tablet (50 mg total) by mouth at bedtime.   Vitamin D, Ergocalciferol, (DRISDOL) 1.25 MG (50000 UNIT) CAPS capsule Take 50,000 Units by mouth every 30 (thirty) days.   [DISCONTINUED]  predniSONE (DELTASONE) 20 MG tablet Give Two tablets by mouth at bedtime until 08/17/22. Give One tablet by mouth at bedtime until 08/19/22   No facility-administered encounter medications on file as of 09/04/2022.    Review of Systems  Immunization History  Administered Date(s) Administered   Influenza-Unspecified 02/29/2020, 02/26/2021, 02/26/2022   MODERNA COVID-19 SARS-COV-2 PEDS BIVALENT BOOSTER 6Y-11Y 03/22/2022   Moderna Covid-19 Vaccine Bivalent Booster 54yrs & up 10/09/2021, 03/22/2022   Moderna Sars-Covid-2 Vaccination 05/23/2019, 06/20/2019, 03/22/2020, 09/29/2020, 02/02/2021   Pneumococcal Polysaccharide-23 10/12/2011   Pertinent  Health Maintenance Due  Topic Date Due   DEXA SCAN  Never done   INFLUENZA VACCINE  12/12/2022      05/12/2021    7:20 PM 12/05/2021    5:21 PM  Fall Risk  (RETIRED) Patient Fall Risk Level High fall risk High fall risk   Functional Status Survey:    Vitals:   09/04/22 1303  BP: 132/76  Pulse: 84  Resp: 16  Temp: 97.8 F (36.6 C)  SpO2: 94%  Weight: 128 lb 9.6 oz (58.3 kg)  Height: 5\' 6"  (1.676 m)   Body mass index is 20.76 kg/m. Physical Exam Constitutional:      Appearance: Normal appearance.  Cardiovascular:     Rate and Rhythm: Normal rate.     Pulses: Normal pulses.  Musculoskeletal:     Comments: Left leg flexed at the knee and hip and internally rotated. Resistant to extension  Neurological:     Mental Status: She is alert.     Labs reviewed: Recent Labs    12/05/21 1722 03/29/22 0000 08/14/22 0850 08/15/22 0000  NA 136 138 136 137  K 4.0 4.5 4.8 4.6  CL 101 106 103 102  CO2 26 26* 24 27*  GLUCOSE 110*  --  124*  --   BUN 19 22* 25* 23*  CREATININE 0.77 0.9 0.79 0.8  CALCIUM 8.8* 9.1 9.3 9.4   Recent Labs    12/05/21 1722 03/29/22 0000 08/15/22 0000  AST 29 30 22   ALT 18 11 11   ALKPHOS 82 86 66  BILITOT 0.8  --   --   PROT 6.8  --   --   ALBUMIN 3.8 3.7 3.4*   Recent Labs    12/05/21 1722  03/29/22 0000 05/09/22 0000 06/17/22 0000 08/14/22 0850 08/15/22 0000  WBC 12.0*   < > 7.6 6.5 8.2 6.9  NEUTROABS  --    < > 4,697.00 3,211.00  --  5,127.00  HGB 9.5*   < > 8.2* 9.7* 11.3* 10.7*  HCT 30.5*   < > 28* 31* 36.0 34*  MCV 85.0  --   --   --  86.7  --   PLT 307   < > 365 373 369 369   < > = values in this interval not displayed.  Lab Results  Component Value Date   TSH 1.78 01/14/2014   No results found for: "HGBA1C" No results found for: "CHOL", "HDL", "LDLCALC", "LDLDIRECT", "TRIG", "CHOLHDL"  Significant Diagnostic Results in last 30 days:  DG HIP UNILAT WITH PELVIS 2-3 VIEWS LEFT  Result Date: 08/14/2022 CLINICAL DATA:  Status post reduction of dislocated left hip prosthesis EXAM: DG HIP (WITH OR WITHOUT PELVIS) 2-3V LEFT COMPARISON:  Study done earlier today FINDINGS: There is interval reduction of dislocated prosthetic left hip. Fluoroscopy time 4 seconds. Radiation dose 0.7 mGy. IMPRESSION: Fluoroscopic assistance was provided for reduction of dislocated prosthetic left hip. Electronically Signed   By: Ernie Avena M.D.   On: 08/14/2022 14:56   DG C-Arm 1-60 Min-No Report  Result Date: 08/14/2022 Fluoroscopy was utilized by the requesting physician.  No radiographic interpretation.   DG Pelvis Portable  Result Date: 08/14/2022 CLINICAL DATA:  Post reduction. EXAM: PORTABLE PELVIS 1-2 VIEWS COMPARISON:  Left hip x-rays from same day. FINDINGS: The prosthetic left femoral head remains dislocated. IMPRESSION: 1. Persistent dislocation of the prosthetic left femoral head. Electronically Signed   By: Obie Dredge M.D.   On: 08/14/2022 11:35   DG Hip Unilat W or Wo Pelvis 2-3 Views Left  Result Date: 08/14/2022 CLINICAL DATA:  Pain.  Left hip dislocation. EXAM: DG HIP (WITH OR WITHOUT PELVIS) 2-3V LEFT COMPARISON:  04/14/2000 FINDINGS: Dislocation of the prosthetic femoral head on the left relative to the prosthetic acetabulum, in the superior direction. No  evidence of regional fracture. Right hip replacement appears unremarkable. IMPRESSION: Superior dislocation of the prosthetic left femoral head. Electronically Signed   By: Paulina Fusi M.D.   On: 08/14/2022 09:20    Assessment/Plan Closed dislocation of left hip, subsequent encounter Patient with continued pain despite scheduled tylenol and tramadol. Discussed concern that patient remains uncomfortable and now has contracture of lle and what this means for prognosis. She has decreased albumin at 3.4. Weight has been stable, however, given patient's age and low BMI she is at high risk for a rapid decline with changes in socialization and mobility -- she has been confined to bed due to pain. Will discuss further with hospice regarding criteria.   Family/ staff Communication: nursing  Labs/tests ordered:  none

## 2022-09-16 ENCOUNTER — Encounter: Payer: Self-pay | Admitting: Student

## 2022-09-16 ENCOUNTER — Other Ambulatory Visit: Payer: Self-pay | Admitting: Student

## 2022-09-16 ENCOUNTER — Non-Acute Institutional Stay (SKILLED_NURSING_FACILITY): Admitting: Student

## 2022-09-16 DIAGNOSIS — Z515 Encounter for palliative care: Secondary | ICD-10-CM

## 2022-09-16 DIAGNOSIS — G301 Alzheimer's disease with late onset: Secondary | ICD-10-CM | POA: Diagnosis not present

## 2022-09-16 DIAGNOSIS — F02B0 Dementia in other diseases classified elsewhere, moderate, without behavioral disturbance, psychotic disturbance, mood disturbance, and anxiety: Secondary | ICD-10-CM

## 2022-09-16 DIAGNOSIS — S73005D Unspecified dislocation of left hip, subsequent encounter: Secondary | ICD-10-CM

## 2022-09-16 DIAGNOSIS — L299 Pruritus, unspecified: Secondary | ICD-10-CM | POA: Diagnosis not present

## 2022-09-16 MED ORDER — MORPHINE SULFATE (CONCENTRATE) 20 MG/ML PO SOLN
5.0000 mg | ORAL | 0 refills | Status: DC | PRN
Start: 2022-09-16 — End: 2022-09-16

## 2022-09-16 MED ORDER — LORAZEPAM 0.5 MG PO TABS
0.5000 mg | ORAL_TABLET | Freq: Three times a day (TID) | ORAL | 0 refills | Status: DC
Start: 2022-09-16 — End: 2022-11-07

## 2022-09-16 NOTE — Progress Notes (Signed)
Comfort meds °

## 2022-09-16 NOTE — Progress Notes (Unsigned)
Location:  Other [place].  Nursing Home Room Number: [facility] [address] Place of Service:  SNF [phone]) Provider:  Earnestine Mealing, MD  Patient Care Team: Earnestine Mealing, MD as PCP - General (Family Medicine)  Extended Emergency Contact Information Primary Emergency Contact: [facility] Phone: [phone] Relation: Daughter Preferred language: English Interpreter needed? No Secondary Emergency Contact: [facility] Mobile Phone: [phone] Relation: Son  Code Status:  DNR Goals of care: Advanced Directive information    09/16/2022   10:19 AM  Advanced Directives  Does Patient Have a Medical Advance Directive? Yes  Type of Advance Directive Out of facility DNR (pink MOST or yellow form)  Does patient want to make changes to medical advance directive? No - Patient declined     Chief Complaint  Patient presents with   Medical Management of Chronic Issues    Medical Management of Chronic Issues.     HPI:  [name] seen today for medical management of chronic diseases.    Patient is alert and eating breakfast. She is pleasant     Past Medical History:  Diagnosis Date   Elevated cholesterol    Hypertension    Past Surgical History:  Procedure Laterality Date   arthroscopic shoulder Right 2001   BASAL CELL CARCINOMA EXCISION  2008   on nose   basal joint hand replace  2000   CATARACT EXTRACTION, BILATERAL  1999   child birth  70 and 1954   x 2   CYSTECTOMY  2009   lower back   HIP CLOSED REDUCTION Left 08/14/2022   Procedure: CLOSED REDUCTION HIP;  Surgeon: Signa Kell, MD;  Location: [facility] ORS;  Service: Orthopedics;  Laterality: Left;   PILONIDAL CYST EXCISION  1951   TOTAL HIP ARTHROPLASTY Left 12/01   TOTAL HIP ARTHROPLASTY Right 2003    Allergies  Allergen Reactions   Etodolac Other (See Comments)    Increased BP   Tape     Outpatient Encounter Medications as of 09/16/2022  Medication Sig   acetaminophen (TYLENOL) 325  MG tablet Take 325 mg by mouth every 8 (eight) hours as needed.   acetaminophen (TYLENOL) 650 MG CR tablet Take 650 mg by mouth 3 (three) times daily.   escitalopram (LEXAPRO) 10 MG tablet Take 10 mg by mouth daily.   HYDROcodone-acetaminophen (NORCO/VICODIN) 5-325 MG tablet Take 1 tablet by mouth every 8 (eight) hours as needed for moderate pain.   hydrocortisone 2.5 % cream Apply to hemorrhoids topically three times daily as needed.   Infant Care Products [facility]) OINT Apply to sacrum, coccyx,buttocks topically as needed for redness.   loperamide (IMODIUM A-D) 2 MG tablet Take 2 mg by mouth as needed for diarrhea or loose stools.   LORazepam (ATIVAN) 0.5 MG tablet Take 1 tablet (0.5 mg total) by mouth every 8 (eight) hours.   Magnesium Hydroxide (MILK OF MAGNESIA PO) Take 30 mLs by mouth every 12 (twelve) hours as needed.   morphine (ROXANOL) 20 MG/ML concentrated solution Take 0.25 mg by mouth every 4 (four) hours as needed for severe pain.   ondansetron (ZOFRAN) 8 MG tablet Take 8 mg by mouth every 8 (eight) hours as needed for nausea or vomiting.   polyethylene glycol (MIRALAX / GLYCOLAX) 17 g packet Take 17 g by mouth daily.   senna (SENOKOT) 8.6 MG TABS tablet Take 2 tablets (17.2 mg total) by mouth at bedtime.   traMADol (ULTRAM) 50 MG tablet Take 50 mg by mouth every 8 (eight)  hours as needed. Take one tablet by mouth three times daily   traZODone (DESYREL) 50 MG tablet Take 1 tablet (50 mg total) by mouth at bedtime.   [DISCONTINUED] ferrous sulfate 220 (44 Fe) MG/5ML solution Take 220 mg by mouth daily.   [DISCONTINUED] morphine (ROXANOL) 20 MG/ML concentrated solution Take 0.25 mLs (5 mg total) by mouth every 2 (two) hours as needed for severe pain. (Patient taking differently: Take 5 mg by mouth every 4 (four) hours as needed for severe pain.)   [DISCONTINUED] Multiple Vitamins-Minerals (MULTIVITAMINS THER. W/MINERALS) TABS tablet Take 1 tablet by mouth daily.   [DISCONTINUED]  traMADol (ULTRAM) 50 MG tablet Take 1 tablet (50 mg total) by mouth in the morning, at noon, and at bedtime. 1 tablet by mouth TID scheduled, may have additional tablet every 8 hours as needed pain (Patient taking differently: Take 50 mg by mouth in the morning, at noon, and at bedtime.  may have additional tablet every 8 hours as needed pain)   [DISCONTINUED] Vitamin D, Ergocalciferol, (DRISDOL) 1.25 MG (50000 UNIT) CAPS capsule Take 50,000 Units by mouth every 30 (thirty) days.   No facility-administered encounter medications on file as of 09/16/2022.    Review of Systems  Immunization History  Administered Date(s) Administered   Influenza-Unspecified 02/29/2020, 02/26/2021, 02/26/2022   MODERNA COVID-19 SARS-COV-2 PEDS BIVALENT BOOSTER 6Y-11Y 03/22/2022   Moderna Covid-19 Vaccine Bivalent Booster 66yrs & up 10/09/2021, 03/22/2022   Moderna Sars-Covid-2 Vaccination 05/23/2019, 06/20/2019, 03/22/2020, 09/29/2020, 02/02/2021   Pneumococcal Polysaccharide-23 10/12/2011   Pertinent  Health Maintenance Due  Topic Date Due   DEXA SCAN  Never done   INFLUENZA VACCINE  12/12/2022      05/12/2021    7:20 PM 12/05/2021    5:21 PM  Fall Risk  (RETIRED) Patient Fall Risk Level High fall risk High fall risk   Functional Status Survey:    Vitals:   09/16/22 1006  BP: 132/76  Pulse: 84  Resp: 16  Temp: 97.8 F (36.6 C)  SpO2: 94%  Weight: 118 lb (53.5 kg)  Height: 5\' 6"  (1.676 m)   Body mass index is 19.05 kg/m. Physical Exam Cardiovascular:     Rate and Rhythm: Normal rate.     Pulses: Normal pulses.  Skin:    Comments: Right flank and shoulder with excoriations.   Neurological:     Mental Status: She is alert. Mental status is at baseline. She is disoriented.     Labs reviewed: Recent Labs    12/05/21 1722 03/29/22 0000 08/14/22 0850 08/15/22 0000  NA 136 138 136 137  K 4.0 4.5 4.8 4.6  CL 101 106 103 102  CO2 26 26* 24 27*  GLUCOSE 110*  --  124*  --   BUN 19 22*  25* 23*  CREATININE 0.77 0.9 0.79 0.8  CALCIUM 8.8* 9.1 9.3 9.4   Recent Labs    12/05/21 1722 03/29/22 0000 08/15/22 0000  AST 29 30 22   ALT 18 11 11   ALKPHOS 82 86 66  BILITOT 0.8  --   --   PROT 6.8  --   --   ALBUMIN 3.8 3.7 3.4*   Recent Labs    12/05/21 1722 03/29/22 0000 05/09/22 0000 06/17/22 0000 08/14/22 0850 08/15/22 0000  WBC 12.0*   < > 7.6 6.5 8.2 6.9  NEUTROABS  --    < > 4,697.00 3,211.00  --  5,127.00  HGB 9.5*   < > 8.2* 9.7* 11.3* 10.7*  HCT 30.5*   < >  28* 31* 36.0 34*  MCV 85.0  --   --   --  86.7  --   PLT 307   < > 365 373 369 369   < > = values in this interval not displayed.   Lab Results  Component Value Date   TSH 1.78 01/14/2014   No results found for: "HGBA1C" No results found for: "CHOL", "HDL", "LDLCALC", "LDLDIRECT", "TRIG", "CHOLHDL"  Significant Diagnostic Results in last 30 days:  No results found.  Assessment/Plan Hospice care patient  Closed dislocation of left hip, subsequent encounter  Moderate late onset Alzheimer's dementia without behavioral disturbance, psychotic disturbance, mood disturbance, or anxiety (HCC)  Pruritus Patient with new excoriations on right shoulder and flank. No clear rash defined or surrounding erythema. Patient is now in bed more often with leg contracture and predominantly laying on the right side due to pain. Plan for low potency steroid cream for 7 days to help with itching as well as nightly hydroxyzine 12.5 mg prn. Due to hip dislocation and no surgery, patient is on hospice care as it has limited her mobility signficantly. She is still eating well and feeding herself. Will continue to monitor for decline. Comfort meds ordered.    Family/ staff Communication: nursing  Labs/tests ordered:  none

## 2022-09-27 ENCOUNTER — Other Ambulatory Visit: Payer: Self-pay | Admitting: Student

## 2022-09-27 DIAGNOSIS — M1990 Unspecified osteoarthritis, unspecified site: Secondary | ICD-10-CM

## 2022-09-27 MED ORDER — HYDROCODONE-ACETAMINOPHEN 5-325 MG PO TABS: 1.0000 | ORAL_TABLET | Freq: Three times a day (TID) | ORAL | 0 refills | Status: DC | PRN

## 2022-09-27 NOTE — Progress Notes (Signed)
Refill chronic med

## 2022-10-01 ENCOUNTER — Other Ambulatory Visit: Payer: Self-pay | Admitting: Nurse Practitioner

## 2022-10-01 MED ORDER — TRAMADOL HCL 50 MG PO TABS: 50.0000 mg | ORAL_TABLET | Freq: Three times a day (TID) | ORAL | 0 refills | Status: DC | PRN

## 2022-10-23 ENCOUNTER — Other Ambulatory Visit: Payer: Self-pay | Admitting: Nurse Practitioner

## 2022-10-23 NOTE — Telephone Encounter (Signed)
Patient is requesting a refill of the following medications: Requested Prescriptions   Pending Prescriptions Disp Refills   traMADol (ULTRAM) 50 MG tablet [Pharmacy Med Name: traMADol HCl 50 MG Tablet] 60 tablet 5    Sig: TAKE 1 TABLET BY MOUTH THREE TIMES A DAY;TAKE 1 TABLET BY MOUTH EVERY 8 HOURS AS NEEDED    Date of last refill: 10/01/22  Refill amount: 0  Treatment agreement date:  Unknown

## 2022-11-07 ENCOUNTER — Encounter: Payer: Self-pay | Admitting: Nurse Practitioner

## 2022-11-07 ENCOUNTER — Non-Acute Institutional Stay (SKILLED_NURSING_FACILITY): Payer: Medicare Other | Admitting: Nurse Practitioner

## 2022-11-07 DIAGNOSIS — S73005D Unspecified dislocation of left hip, subsequent encounter: Secondary | ICD-10-CM | POA: Diagnosis not present

## 2022-11-07 DIAGNOSIS — F02C0 Dementia in other diseases classified elsewhere, severe, without behavioral disturbance, psychotic disturbance, mood disturbance, and anxiety: Secondary | ICD-10-CM

## 2022-11-07 DIAGNOSIS — G301 Alzheimer's disease with late onset: Secondary | ICD-10-CM

## 2022-11-07 DIAGNOSIS — F39 Unspecified mood [affective] disorder: Secondary | ICD-10-CM | POA: Diagnosis not present

## 2022-11-07 NOTE — Progress Notes (Signed)
Location:  Other Nursing Home Room Number: 316 A Place of Service:  SNF ([id number])  Earnestine Mealing, MD  Patient Care Team: Earnestine Mealing, MD as PCP - General (Family Medicine)  Extended Emergency Contact Information Primary Emergency Contact: [facility] Phone: [phone] Relation: Daughter Preferred language: English Interpreter needed? No Secondary Emergency Contact: [facility] Mobile Phone: [phone] Relation: Son  Goals of care: Advanced Directive information    11/07/2022    3:14 PM  Advanced Directives  Does Patient Have a Medical Advance Directive? Yes  Type of Advance Directive Out of facility DNR (pink MOST or yellow form)  Does patient want to make changes to medical advance directive? No - Patient declined  Pre-existing out of facility DNR order (yellow form or pink MOST form) Yellow form placed in chart (order not valid for inpatient use)     Chief Complaint  Patient presents with   Medical Management of Chronic Issues    Routine follow up   Immunizations    Tdap, shingrix, and pneumonia vaccine due. Current COVID booster due   Quality Metric Gaps    Dexa scan due    HPI:  [name] seen today for medical management of chronic disease.  Pt is under hospice care.  She is bed bound due to chronic dislocation of left hip- not a surgery candidate   She is using tramadol scheduled for pain and has PRN order.  Also has morphine if needed for uncontrolled pain.  Staff has no acute concerns at this time   Past Medical History:  Diagnosis Date   Elevated cholesterol    Hypertension    Past Surgical History:  Procedure Laterality Date   arthroscopic shoulder Right 2001   BASAL CELL CARCINOMA EXCISION  2008   on nose   basal joint hand replace  2000   CATARACT EXTRACTION, BILATERAL  1999   child birth  36 and 1954   x 2   CYSTECTOMY  2009   lower back   HIP CLOSED REDUCTION Left 08/14/2022   Procedure: CLOSED REDUCTION  HIP;  Surgeon: Signa Kell, MD;  Location: [facility] ORS;  Service: Orthopedics;  Laterality: Left;   PILONIDAL CYST EXCISION  1951   TOTAL HIP ARTHROPLASTY Left 12/01   TOTAL HIP ARTHROPLASTY Right 2003    Allergies  Allergen Reactions   Etodolac Other (See Comments)    Increased BP   Tape     Outpatient Encounter Medications as of 11/07/2022  Medication Sig   acetaminophen (TYLENOL) 325 MG tablet Take 325 mg by mouth every 8 (eight) hours as needed.   acetaminophen (TYLENOL) 650 MG CR tablet Take 650 mg by mouth 3 (three) times daily.   escitalopram (LEXAPRO) 10 MG tablet Take 10 mg by mouth daily.   HYDROcodone-acetaminophen (NORCO/VICODIN) 5-325 MG tablet Take 1 tablet by mouth every 8 (eight) hours as needed for moderate pain.   hydrocortisone 2.5 % cream Apply to hemorrhoids topically three times daily as needed.   lidocaine 4 % Place 1 patch onto the skin daily as needed.   loperamide (IMODIUM A-D) 2 MG tablet Take 2 mg by mouth as needed for diarrhea or loose stools.   Magnesium Hydroxide (MILK OF MAGNESIA PO) Take 30 mLs by mouth every 12 (twelve) hours as needed.   Morphine Sulfate, Concentrate, 5 MG/0.25ML SOLN Take 0.25 mg by mouth every 4 (four) hours as needed for severe pain.   ondansetron (ZOFRAN) 8 MG tablet Take 8 mg by mouth every  8 (eight) hours as needed for nausea or vomiting.   polyethylene glycol (MIRALAX / GLYCOLAX) 17 g packet Take 17 g by mouth daily.   sennosides-docusate sodium (SENOKOT-S) 8.6-50 MG tablet Take 1 tablet by mouth daily as needed for constipation.   traMADol (ULTRAM) 50 MG tablet TAKE 1 TABLET BY MOUTH THREE TIMES A DAY;TAKE 1 TABLET BY MOUTH EVERY 8 HOURS AS NEEDED   traZODone (DESYREL) 50 MG tablet Take 1 tablet (50 mg total) by mouth at bedtime.   ZINC OXIDE EX Apply topically. Apply to sacrum, coccyx, buttocks topically as needed for protection   [DISCONTINUED] Infant Care Products (DERMACLOUD) OINT Apply to sacrum, coccyx,buttocks topically as  needed for redness.   [DISCONTINUED] LORazepam (ATIVAN) 0.5 MG tablet Take 1 tablet (0.5 mg total) by mouth every 8 (eight) hours.   [DISCONTINUED] senna (SENOKOT) 8.6 MG TABS tablet Take 2 tablets (17.2 mg total) by mouth at bedtime.   No facility-administered encounter medications on file as of 11/07/2022.    Review of Systems  Unable to perform ROS: Dementia     Immunization History  Administered Date(s) Administered   Influenza-Unspecified 02/29/2020, 02/26/2021, 02/26/2022   MODERNA COVID-19 SARS-COV-2 PEDS BIVALENT BOOSTER 6Y-11Y 03/22/2022   Moderna Covid-19 Vaccine Bivalent Booster 39yrs & up 10/09/2021, 03/22/2022   Moderna Sars-Covid-2 Vaccination 05/23/2019, 06/20/2019, 03/22/2020, 09/29/2020, 02/02/2021   Pneumococcal Polysaccharide-23 10/12/2011   Pertinent  Health Maintenance Due  Topic Date Due   DEXA SCAN  Never done   INFLUENZA VACCINE  12/12/2022      05/12/2021    7:20 PM 12/05/2021    5:21 PM  Fall Risk  (RETIRED) Patient Fall Risk Level High fall risk High fall risk   Functional Status Survey:    Vitals:   11/07/22 1513  BP: 132/76  Pulse: 84  Resp: 16  Temp: 97.8 F (36.6 C)  SpO2: 94%  Weight: 114 lb 6.4 oz (51.9 kg)  Height: 5\' 6"  (1.676 m)   Body mass index is 18.46 kg/m. Physical Exam Constitutional:      General: She is not in acute distress.    Appearance: She is well-developed. She is not diaphoretic.  HENT:     Head: Normocephalic and atraumatic.     Mouth/Throat:     Pharynx: No oropharyngeal exudate.  Eyes:     Conjunctiva/sclera: Conjunctivae normal.     Pupils: Pupils are equal, round, and reactive to light.  Cardiovascular:     Rate and Rhythm: Normal rate and regular rhythm.     Heart sounds: Normal heart sounds.  Pulmonary:     Effort: Pulmonary effort is normal.     Breath sounds: Normal breath sounds.  Abdominal:     General: Bowel sounds are normal.     Palpations: Abdomen is soft.  Musculoskeletal:     Cervical  back: Normal range of motion and neck supple.     Right lower leg: No edema.     Left lower leg: No edema.  Skin:    General: Skin is warm and dry.  Neurological:     Mental Status: She is alert.  Psychiatric:        Mood and Affect: Mood normal.     Labs reviewed: Recent Labs    12/05/21 1722 03/29/22 0000 08/14/22 0850 08/15/22 0000  NA 136 138 136 137  K 4.0 4.5 4.8 4.6  CL 101 106 103 102  CO2 26 26* 24 27*  GLUCOSE 110*  --  124*  --  BUN 19 22* 25* 23*  CREATININE 0.77 0.9 0.79 0.8  CALCIUM 8.8* 9.1 9.3 9.4   Recent Labs    12/05/21 1722 03/29/22 0000 08/15/22 0000  AST 29 30 22   ALT 18 11 11   ALKPHOS 82 86 66  BILITOT 0.8  --   --   PROT 6.8  --   --   ALBUMIN 3.8 3.7 3.4*   Recent Labs    12/05/21 1722 03/29/22 0000 05/09/22 0000 06/17/22 0000 08/14/22 0850 08/15/22 0000  WBC 12.0*   < > 7.6 6.5 8.2 6.9  NEUTROABS  --    < > 4,697.00 3,211.00  --  5,127.00  HGB 9.5*   < > 8.2* 9.7* 11.3* 10.7*  HCT 30.5*   < > 28* 31* 36.0 34*  MCV 85.0  --   --   --  86.7  --   PLT 307   < > 365 373 369 369   < > = values in this interval not displayed.   Lab Results  Component Value Date   TSH 1.78 01/14/2014   No results found for: "HGBA1C" No results found for: "CHOL", "HDL", "LDLCALC", "LDLDIRECT", "TRIG", "CHOLHDL"  Significant Diagnostic Results in last 30 days:  No results found.  Assessment/Plan 1. Closed dislocation of left hip, subsequent encounter -continues to be bed bound due to pain. She has tramadol schedule and tramadol,hydrocodone-apap and morphine PRN. Will dc hydrocodone-apap at this time and continue tramadol and morphine for PRN use  Continue comfort measures.   2. severe late onset Alzheimer's dementia without behavioral disturbance, psychotic disturbance, mood disturbance, or anxiety (HCC) -advanced. Disease, total care by staff. Continue supportive care  3. Mood disorder (HCC) Stable on lexapro    Sharrie Self K. Biagio Borg [facility] [phone]

## 2022-11-11 DIAGNOSIS — F02C0 Dementia in other diseases classified elsewhere, severe, without behavioral disturbance, psychotic disturbance, mood disturbance, and anxiety: Secondary | ICD-10-CM | POA: Insufficient documentation

## 2022-11-11 DIAGNOSIS — G301 Alzheimer's disease with late onset: Secondary | ICD-10-CM | POA: Insufficient documentation

## 2022-12-06 ENCOUNTER — Non-Acute Institutional Stay (SKILLED_NURSING_FACILITY): Payer: Medicare Other | Admitting: Student

## 2022-12-06 ENCOUNTER — Encounter: Payer: Self-pay | Admitting: Student

## 2022-12-06 DIAGNOSIS — F02C0 Dementia in other diseases classified elsewhere, severe, without behavioral disturbance, psychotic disturbance, mood disturbance, and anxiety: Secondary | ICD-10-CM

## 2022-12-06 DIAGNOSIS — S73005D Unspecified dislocation of left hip, subsequent encounter: Secondary | ICD-10-CM | POA: Diagnosis not present

## 2022-12-06 DIAGNOSIS — Z515 Encounter for palliative care: Secondary | ICD-10-CM

## 2022-12-06 DIAGNOSIS — G301 Alzheimer's disease with late onset: Secondary | ICD-10-CM

## 2022-12-06 NOTE — Progress Notes (Unsigned)
Location:  Other [facility] [place].  Nursing Home Room Number: [facility] [address] Place of Service:  SNF [phone]) Provider:  Earnestine Mealing, MD  Patient Care Team: Earnestine Mealing, MD as PCP - General (Family Medicine)  Extended Emergency Contact Information Primary Emergency Contact: [facility] Phone: [phone] Relation: Daughter Preferred language: English Interpreter needed? No Secondary Emergency Contact: [facility] Mobile Phone: [phone] Relation: Son  Code Status:  DNR Goals of care: Advanced Directive information    12/06/2022    3:22 PM  Advanced Directives  Does Patient Have a Medical Advance Directive? Yes  Type of Advance Directive Out of facility DNR (pink MOST or yellow form)  Does patient want to make changes to medical advance directive? No - Patient declined     Chief Complaint  Patient presents with   Medical Management of Chronic Issues    Medical Management of Chronic Issues.     HPI:  Pt is a 88 y.o. female seen today for medical management of chronic diseases.    Patient is laying in her bed she smiles pleasantly eating her breakfast.  Nursing primary concern is that she cries out for help often. Seems as if she is in pain. Some improvement with morphine. OT consultation for positioning, however, this makes it worse.    Past Medical History:  Diagnosis Date   Elevated cholesterol    Hypertension    Past Surgical History:  Procedure Laterality Date   arthroscopic shoulder Right 2001   BASAL CELL CARCINOMA EXCISION  2008   on nose   basal joint hand replace  2000   CATARACT EXTRACTION, BILATERAL  1999   child birth  26 and 1954   x 2   CYSTECTOMY  2009   lower back   HIP CLOSED REDUCTION Left 08/14/2022   Procedure: CLOSED REDUCTION HIP;  Surgeon: Signa Kell, MD;  Location: [facility] ORS;  Service: Orthopedics;  Laterality: Left;   PILONIDAL CYST EXCISION  1951   TOTAL HIP ARTHROPLASTY Left 12/01   TOTAL HIP ARTHROPLASTY Right  2003    Allergies  Allergen Reactions   Etodolac Other (See Comments)    Increased BP   Tape     Outpatient Encounter Medications as of 12/06/2022  Medication Sig   acetaminophen (TYLENOL) 325 MG tablet Take 325 mg by mouth every 8 (eight) hours as needed.   acetaminophen (TYLENOL) 650 MG CR tablet Take 650 mg by mouth 3 (three) times daily.   escitalopram (LEXAPRO) 10 MG tablet Take 10 mg by mouth daily.   hydrocortisone 2.5 % cream Apply to hemorrhoids topically three times daily as needed.   lidocaine 4 % Place 1 patch onto the skin daily as needed.   loperamide (IMODIUM A-D) 2 MG tablet Take 2 mg by mouth as needed for diarrhea or loose stools.   Magnesium Hydroxide (MILK OF MAGNESIA PO) Take 30 mLs by mouth every 12 (twelve) hours as needed.   Morphine Sulfate, Concentrate, 5 MG/0.25ML SOLN Take 0.25 mg by mouth every 4 (four) hours as needed for severe pain.   ondansetron (ZOFRAN) 8 MG tablet Take 8 mg by mouth every 8 (eight) hours as needed for nausea or vomiting.   polyethylene glycol (MIRALAX / GLYCOLAX) 17 g packet Take 17 g by mouth daily.   sennosides-docusate sodium (SENOKOT-S) 8.6-50 MG tablet Take 1 tablet by mouth daily as needed for constipation.   traMADol (ULTRAM) 50 MG tablet TAKE 1 TABLET BY MOUTH THREE TIMES A DAY;TAKE 1 TABLET BY MOUTH EVERY 8  HOURS AS NEEDED   traZODone (DESYREL) 50 MG tablet Take 1 tablet (50 mg total) by mouth at bedtime.   ZINC OXIDE EX Apply topically. Apply to sacrum, coccyx, buttocks topically as needed for protection   [DISCONTINUED] acetaminophen (TYLENOL) 650 MG CR tablet Take 650 mg by mouth 3 (three) times daily.   [DISCONTINUED] HYDROcodone-acetaminophen (NORCO/VICODIN) 5-325 MG tablet Take 1 tablet by mouth every 8 (eight) hours as needed for moderate pain.   No facility-administered encounter medications on file as of 12/06/2022.    Review of Systems  Immunization History  Administered Date(s) Administered    Influenza-Unspecified 02/29/2020, 02/26/2021, 02/26/2022   MODERNA COVID-19 SARS-COV-2 PEDS BIVALENT BOOSTER 48yr-55yr 03/22/2022   Moderna Covid-19 Vaccine Bivalent Booster 31yrs & up 10/09/2021, 03/22/2022   Moderna Sars-Covid-2 Vaccination 05/23/2019, 06/20/2019, 03/22/2020, 09/29/2020, 02/02/2021   Pneumococcal Polysaccharide-23 10/12/2011   Pertinent  Health Maintenance Due  Topic Date Due   DEXA SCAN  Never done   INFLUENZA VACCINE  12/12/2022      05/12/2021    7:20 PM 12/05/2021    5:21 PM  Fall Risk  (RETIRED) Patient Fall Risk Level High fall risk High fall risk   Functional Status Survey:    Vitals:   12/06/22 1349  BP: 124/70  Pulse: 79  Resp: 18  Temp: 98 F (36.7 C)  SpO2: 94%  Weight: 112 lb 9.6 oz (51.1 kg)  Height: 5\' 6"  (1.676 m)   Body mass index is 18.17 kg/m. Physical Exam Cardiovascular:     Rate and Rhythm: Normal rate.     Pulses: Normal pulses.  Pulmonary:     Effort: Pulmonary effort is normal.  Abdominal:     General: Abdomen is flat.     Palpations: Abdomen is soft.  Musculoskeletal:     Comments: Left leg contracture   Skin:    General: Skin is warm and dry.  Neurological:     Mental Status: She is alert.     Comments: Oriented to self.      Labs reviewed: Recent Labs    03/29/22 0000 08/14/22 0850 08/15/22 0000  NA 138 136 137  K 4.5 4.8 4.6  CL 106 103 102  CO2 26* 24 27*  GLUCOSE  --  124*  --   BUN 22* 25* 23*  CREATININE 0.9 0.79 0.8  CALCIUM 9.1 9.3 9.4   Recent Labs    03/29/22 0000 08/15/22 0000  AST 30 22  ALT 11 11  ALKPHOS 86 66  ALBUMIN 3.7 3.4*   Recent Labs    05/09/22 0000 06/17/22 0000 08/14/22 0850 08/15/22 0000  WBC 7.6 6.5 8.2 6.9  NEUTROABS 4,697.00 3,211.00  --  5,127.00  HGB 8.2* 9.7* 11.3* 10.7*  HCT 28* 31* 36.0 34*  MCV  --   --  86.7  --   PLT 365 373 369 369   Lab Results  Component Value Date   TSH 1.78 01/14/2014   No results found for: "HGBA1C" No results found for:  "CHOL", "HDL", "LDLCALC", "LDLDIRECT", "TRIG", "CHOLHDL"  Significant Diagnostic Results in last 30 days:  No results found.  Assessment/Plan Severe late onset Alzheimer's dementia without behavioral disturbance, psychotic disturbance, mood disturbance, or anxiety (HCC)  Closed dislocation of left hip, subsequent encounter  Hospice care patient Patient with continued pain, morphine PRN for breakthrough pain. Hip remains displaced. Continue supportive care. Patient has lost weight in the last month reflective of her overall decline.  Family/ staff Communication: Nursing  Labs/tests ordered:  none

## 2022-12-07 ENCOUNTER — Encounter: Payer: Self-pay | Admitting: Student

## 2022-12-20 ENCOUNTER — Other Ambulatory Visit: Payer: Self-pay | Admitting: Adult Health

## 2022-12-20 MED ORDER — TRAMADOL HCL 50 MG PO TABS: 50.0000 mg | ORAL_TABLET | Freq: Three times a day (TID) | ORAL | 0 refills | Status: DC

## 2023-01-07 ENCOUNTER — Encounter: Payer: Self-pay | Admitting: Nurse Practitioner

## 2023-01-07 ENCOUNTER — Non-Acute Institutional Stay (SKILLED_NURSING_FACILITY): Payer: Medicare Other | Admitting: Nurse Practitioner

## 2023-01-07 DIAGNOSIS — M25552 Pain in left hip: Secondary | ICD-10-CM

## 2023-01-07 DIAGNOSIS — G8929 Other chronic pain: Secondary | ICD-10-CM

## 2023-01-07 DIAGNOSIS — G301 Alzheimer's disease with late onset: Secondary | ICD-10-CM | POA: Diagnosis not present

## 2023-01-07 DIAGNOSIS — R634 Abnormal weight loss: Secondary | ICD-10-CM

## 2023-01-07 DIAGNOSIS — F39 Unspecified mood [affective] disorder: Secondary | ICD-10-CM

## 2023-01-07 DIAGNOSIS — K5904 Chronic idiopathic constipation: Secondary | ICD-10-CM | POA: Diagnosis not present

## 2023-01-07 DIAGNOSIS — F02C0 Dementia in other diseases classified elsewhere, severe, without behavioral disturbance, psychotic disturbance, mood disturbance, and anxiety: Secondary | ICD-10-CM

## 2023-01-07 NOTE — Progress Notes (Signed)
Location:  Other [facility].  Nursing Home Room Number: [facility] [address]A Place of Service:  SNF ([phone]) Abbey Chatters, NP  PCP: Earnestine Mealing, MD  Patient Care Team: Earnestine Mealing, MD as PCP - General (Family Medicine)  Extended Emergency Contact Information Primary Emergency Contact: [facility] Phone: [phone] Relation: Daughter Preferred language: English Interpreter needed? No Secondary Emergency Contact: [facility] Mobile Phone: [phone] Relation: Son  Goals of care: Advanced Directive information    01/07/2023   12:47 PM  Advanced Directives  Does Patient Have a Medical Advance Directive? Yes  Type of Advance Directive Out of facility DNR (pink MOST or yellow form)  Does patient want to make changes to medical advance directive? No - Guardian declined     Chief Complaint  Patient presents with   Medical Management of Chronic Issues    Medical Management of Chronic Issues.     HPI:  [name] seen today for medical management of chronic disease.  Pt followed by hospice due to ongoing dislocation of left hip.  She is unable to get out of bed due to significant pain so is bed bound.  Pt denies pain when laying in bed but yells with any movement Staff without concerns.  She has had progressive weight loss noted    Past Medical History:  Diagnosis Date   Elevated cholesterol    Hypertension    Past Surgical History:  Procedure Laterality Date   arthroscopic shoulder Right 2001   BASAL CELL CARCINOMA EXCISION  2008   on nose   basal joint hand replace  2000   CATARACT EXTRACTION, BILATERAL  1999   child birth  77 and 1954   x 2   CYSTECTOMY  2009   lower back   HIP CLOSED REDUCTION Left 08/14/2022   Procedure: CLOSED REDUCTION HIP;  Surgeon: Signa Kell, MD;  Location: [facility] ORS;  Service: Orthopedics;  Laterality: Left;   PILONIDAL CYST EXCISION  1951   TOTAL HIP ARTHROPLASTY Left 12/01   TOTAL HIP ARTHROPLASTY  Right 2003    Allergies  Allergen Reactions   Etodolac Other (See Comments)    Increased BP   Tape     Outpatient Encounter Medications as of 01/07/2023  Medication Sig   acetaminophen (TYLENOL) 325 MG tablet Take 325 mg by mouth every 8 (eight) hours as needed.   acetaminophen (TYLENOL) 650 MG CR tablet Take 650 mg by mouth 3 (three) times daily.   escitalopram (LEXAPRO) 10 MG tablet Take 10 mg by mouth daily.   hydrocortisone 2.5 % cream Apply to hemorrhoids topically three times daily as needed.   lidocaine 4 % Place 1 patch onto the skin daily as needed.   loperamide (IMODIUM A-D) 2 MG tablet Take 2 mg by mouth as needed for diarrhea or loose stools.   Magnesium Hydroxide (MILK OF MAGNESIA PO) Take 30 mLs by mouth every 12 (twelve) hours as needed.   Morphine Sulfate, Concentrate, 5 MG/0.25ML SOLN Take 0.25 mg by mouth every 4 (four) hours as needed for severe pain.   ondansetron (ZOFRAN) 8 MG tablet Take 8 mg by mouth every 8 (eight) hours as needed for nausea or vomiting.   polyethylene glycol (MIRALAX / GLYCOLAX) 17 g packet Take 17 g by mouth daily.   sennosides-docusate sodium (SENOKOT-S) 8.6-50 MG tablet Take 1 tablet by mouth daily as needed for constipation.   traMADol (ULTRAM) 50 MG tablet Take 50 mg by mouth every 8 (eight) hours as needed.  Take one tablet by mouth three times daily for pain.   traZODone (DESYREL) 50 MG tablet Take 1 tablet (50 mg total) by mouth at bedtime.   ZINC OXIDE EX Apply topically. Apply to sacrum, coccyx, buttocks topically as needed for protection   [DISCONTINUED] traMADol (ULTRAM) 50 MG tablet Take 1 tablet (50 mg total) by mouth 3 (three) times daily. (Patient taking differently: Take 50 mg by mouth 3 (three) times daily. Take one tablet by mouth every 8 hours as needed)   No facility-administered encounter medications on file as of 01/07/2023.    Review of Systems  Unable to perform ROS: Dementia     Immunization History  Administered  Date(s) Administered   Influenza-Unspecified 02/29/2020, 02/26/2021, 02/26/2022   MODERNA COVID-19 SARS-COV-2 PEDS BIVALENT BOOSTER 56yr-3yr 03/22/2022   Moderna Covid-19 Vaccine Bivalent Booster 69yrs & up 10/09/2021, 03/22/2022   Moderna Sars-Covid-2 Vaccination 05/23/2019, 06/20/2019, 03/22/2020, 09/29/2020, 02/02/2021   Pneumococcal Polysaccharide-23 10/12/2011   Pertinent  Health Maintenance Due  Topic Date Due   DEXA SCAN  Never done   INFLUENZA VACCINE  12/12/2022      05/12/2021    7:20 PM 12/05/2021    5:21 PM  Fall Risk  (RETIRED) Patient Fall Risk Level High fall risk High fall risk   Functional Status Survey:    Vitals:   01/07/23 1230  BP: (!) 154/80  Pulse: 83  Resp: 17  Temp: 98 F (36.7 C)  SpO2: 94%  Weight: 109 lb (49.4 kg)  Height: 5\' 6"  (1.676 m)   Body mass index is 17.59 kg/m. Physical Exam Constitutional:      General: She is not in acute distress.    Appearance: She is well-developed. She is not diaphoretic.  HENT:     Head: Normocephalic and atraumatic.     Mouth/Throat:     Pharynx: No oropharyngeal exudate.  Eyes:     Conjunctiva/sclera: Conjunctivae normal.     Pupils: Pupils are equal, round, and reactive to light.  Cardiovascular:     Rate and Rhythm: Normal rate and regular rhythm.     Heart sounds: Normal heart sounds.  Pulmonary:     Effort: Pulmonary effort is normal.     Breath sounds: Normal breath sounds.  Abdominal:     General: Bowel sounds are normal.     Palpations: Abdomen is soft.  Musculoskeletal:        General: Tenderness and deformity present.     Cervical back: Normal range of motion and neck supple.     Right lower leg: No edema.     Left lower leg: No edema.  Skin:    General: Skin is warm and dry.  Neurological:     Mental Status: She is alert. She is disoriented.     Gait: Gait abnormal.  Psychiatric:        Mood and Affect: Mood normal.     Labs reviewed: Recent Labs    03/29/22 0000  08/14/22 0850 08/15/22 0000  NA 138 136 137  K 4.5 4.8 4.6  CL 106 103 102  CO2 26* 24 27*  GLUCOSE  --  124*  --   BUN 22* 25* 23*  CREATININE 0.9 0.79 0.8  CALCIUM 9.1 9.3 9.4   Recent Labs    03/29/22 0000 08/15/22 0000  AST 30 22  ALT 11 11  ALKPHOS 86 66  ALBUMIN 3.7 3.4*   Recent Labs    05/09/22 0000 06/17/22 0000 08/14/22 0850 08/15/22 0000  WBC 7.6 6.5 8.2 6.9  NEUTROABS 4,697.00 3,211.00  --  5,127.00  HGB 8.2* 9.7* 11.3* 10.7*  HCT 28* 31* 36.0 34*  MCV  --   --  86.7  --   PLT 365 373 369 369   Lab Results  Component Value Date   TSH 1.78 01/14/2014   No results found for: "HGBA1C" No results found for: "CHOL", "HDL", "LDLCALC", "LDLDIRECT", "TRIG", "CHOLHDL"  Significant Diagnostic Results in last 30 days:  No results found.  Assessment/Plan 1. Severe late onset Alzheimer's dementia without behavioral disturbance, psychotic disturbance, mood disturbance, or anxiety (HCC) -advanced, Stable, no acute changes in cognitive or functional status, continue supportive care.  2. Chronic hip pain, left -due to chronic dislocation, continues on tylenol and tramadol PRN pain control   3. Mood disorder (HCC) Stable on lexapro  4. Chronic idiopathic constipation -controlled on current regimen  5. Weight loss Continue supportive care, Expect to progress due to worsening dementia.   Janene Harvey. Biagio Borg [facility] [phone]

## 2023-01-25 ENCOUNTER — Other Ambulatory Visit: Payer: Self-pay | Admitting: Adult Health

## 2023-01-25 DIAGNOSIS — Z515 Encounter for palliative care: Secondary | ICD-10-CM

## 2023-01-27 DIAGNOSIS — Z515 Encounter for palliative care: Secondary | ICD-10-CM | POA: Insufficient documentation

## 2023-01-27 HISTORY — DX: Encounter for palliative care: Z51.5

## 2023-01-27 NOTE — Telephone Encounter (Signed)
Patient has request refill on medication Tramadol. Patient medication pend and sent to PCP Earnestine Mealing, MD for approval.

## 2023-01-27 NOTE — Telephone Encounter (Signed)
Refill chronic pain medication.

## 2023-02-03 ENCOUNTER — Other Ambulatory Visit: Payer: Self-pay | Admitting: Student

## 2023-02-03 DIAGNOSIS — Z515 Encounter for palliative care: Secondary | ICD-10-CM

## 2023-02-03 MED ORDER — TRAMADOL HCL 50 MG PO TABS
50.0000 mg | ORAL_TABLET | Freq: Four times a day (QID) | ORAL | 0 refills | Status: AC | PRN
Start: 2023-02-03 — End: ?

## 2023-02-03 NOTE — Progress Notes (Signed)
Refill of chronic medication for hospice pain management.

## 2023-02-11 ENCOUNTER — Other Ambulatory Visit: Payer: Self-pay | Admitting: Nurse Practitioner

## 2023-02-11 DIAGNOSIS — Z515 Encounter for palliative care: Secondary | ICD-10-CM

## 2023-02-11 MED ORDER — TRAMADOL HCL 50 MG PO TABS
50.0000 mg | ORAL_TABLET | Freq: Four times a day (QID) | ORAL | 0 refills | Status: DC | PRN
Start: 2023-02-11 — End: 2023-02-24

## 2023-02-24 ENCOUNTER — Other Ambulatory Visit: Payer: Self-pay | Admitting: Adult Health

## 2023-02-24 DIAGNOSIS — Z515 Encounter for palliative care: Secondary | ICD-10-CM

## 2023-02-24 MED ORDER — TRAMADOL HCL 50 MG PO TABS
50.0000 mg | ORAL_TABLET | Freq: Three times a day (TID) | ORAL | 0 refills | Status: DC
Start: 2023-02-24 — End: 2023-02-26

## 2023-02-26 ENCOUNTER — Encounter: Payer: Self-pay | Admitting: Student

## 2023-02-26 ENCOUNTER — Non-Acute Institutional Stay (SKILLED_NURSING_FACILITY): Payer: Medicare Other | Admitting: Student

## 2023-02-26 DIAGNOSIS — D509 Iron deficiency anemia, unspecified: Secondary | ICD-10-CM

## 2023-02-26 DIAGNOSIS — S73005D Unspecified dislocation of left hip, subsequent encounter: Secondary | ICD-10-CM

## 2023-02-26 DIAGNOSIS — G301 Alzheimer's disease with late onset: Secondary | ICD-10-CM

## 2023-02-26 DIAGNOSIS — Z515 Encounter for palliative care: Secondary | ICD-10-CM

## 2023-02-26 DIAGNOSIS — F02C0 Dementia in other diseases classified elsewhere, severe, without behavioral disturbance, psychotic disturbance, mood disturbance, and anxiety: Secondary | ICD-10-CM

## 2023-02-26 DIAGNOSIS — R634 Abnormal weight loss: Secondary | ICD-10-CM

## 2023-02-26 DIAGNOSIS — K5904 Chronic idiopathic constipation: Secondary | ICD-10-CM | POA: Diagnosis not present

## 2023-02-26 NOTE — Progress Notes (Unsigned)
Location:  Other [place].  Nursing Home Room Number: [facility] [address] Place of Service:  SNF [phone]) Provider:  Earnestine Mealing, MD  Patient Care Team: Earnestine Mealing, MD as PCP - General (Family Medicine)  Extended Emergency Contact Information Primary Emergency Contact: [facility] Phone: [phone] Relation: Daughter Preferred language: English Interpreter needed? No Secondary Emergency Contact: [facility] Mobile Phone: [phone] Relation: Son  Code Status:  DNR Goals of care: Advanced Directive information    02/26/2023    9:17 AM  Advanced Directives  Does Patient Have a Medical Advance Directive? Yes  Type of Advance Directive Out of facility DNR (pink MOST or yellow form)  Does patient want to make changes to medical advance directive? No - Patient declined     Chief Complaint  Patient presents with   Medical Management of Chronic Issues    Medical Management of Chronic Issues.     HPI:  [name] seen today for medical management of chronic diseases.    Patient states that she is fine.  She is eating well.  She smiles and weeks.  She states she is from [place].  She said she did nothing for work.  She said she would love to have $1 million.  Denies pain at this time.  Nursing without acute concerns at this time.  Past Medical History:  Diagnosis Date   Elevated cholesterol    Hypertension    Past Surgical History:  Procedure Laterality Date   arthroscopic shoulder Right 2001   BASAL CELL CARCINOMA EXCISION  2008   on nose   basal joint hand replace  2000   CATARACT EXTRACTION, BILATERAL  1999   child birth  74 and 1954   x 2   CYSTECTOMY  2009   lower back   HIP CLOSED REDUCTION Left 08/14/2022   Procedure: CLOSED REDUCTION HIP;  Surgeon: Signa Kell, MD;  Location: [facility] ORS;  Service: Orthopedics;  Laterality: Left;   PILONIDAL CYST EXCISION  1951   TOTAL HIP ARTHROPLASTY Left 12/01   TOTAL HIP ARTHROPLASTY  Right 2003    Allergies  Allergen Reactions   Etodolac Other (See Comments)    Increased BP   Tape     Outpatient Encounter Medications as of 02/26/2023  Medication Sig   acetaminophen (TYLENOL) 325 MG tablet Take 325 mg by mouth every 8 (eight) hours as needed.   acetaminophen (TYLENOL) 650 MG CR tablet Take 650 mg by mouth 3 (three) times daily.   escitalopram (LEXAPRO) 10 MG tablet Take 10 mg by mouth daily.   hydrocortisone 2.5 % cream Apply to hemorrhoids topically three times daily as needed.   lidocaine 4 % Place 1 patch onto the skin daily as needed.   loperamide (IMODIUM A-D) 2 MG tablet Take 2 mg by mouth as needed for diarrhea or loose stools.   Magnesium Hydroxide (MILK OF MAGNESIA PO) Take 30 mLs by mouth every 12 (twelve) hours as needed.   Morphine Sulfate, Concentrate, 5 MG/0.25ML SOLN Take 0.25 mg by mouth every 4 (four) hours as needed for severe pain.   ondansetron (ZOFRAN) 8 MG tablet Take 8 mg by mouth every 8 (eight) hours as needed for nausea or vomiting.   polyethylene glycol (MIRALAX / GLYCOLAX) 17 g packet Take 17 g by mouth daily.   sennosides-docusate sodium (SENOKOT-S) 8.6-50 MG tablet Take 1 tablet by mouth daily as needed for constipation.   traMADol (ULTRAM) 50 MG tablet Take 50 mg by mouth every  8 (eight) hours as needed. Take one tablet by mouth three times daily.   traZODone (DESYREL) 50 MG tablet Take 25 mg by mouth at bedtime.   ZINC OXIDE EX Apply topically. Apply to sacrum, coccyx, buttocks topically as needed for protection   [DISCONTINUED] traMADol (ULTRAM) 50 MG tablet Take 1 tablet (50 mg total) by mouth 3 (three) times daily. (Patient taking differently: Take 50 mg by mouth 3 (three) times daily. Take one tablet by mouth every 8 hours as needed.)   [DISCONTINUED] traZODone (DESYREL) 50 MG tablet Take 1 tablet (50 mg total) by mouth at bedtime. (Patient taking differently: Take 25 mg by mouth at bedtime.)   No facility-administered encounter  medications on file as of 02/26/2023.    Review of Systems  Immunization History  Administered Date(s) Administered   Influenza-Unspecified 02/29/2020, 02/26/2021, 02/26/2022   MODERNA COVID-19 SARS-COV-2 PEDS BIVALENT BOOSTER 2yr-56yr 03/22/2022   Moderna Covid-19 Vaccine Bivalent Booster 26yrs & up 10/09/2021, 03/22/2022   Moderna Sars-Covid-2 Vaccination 05/23/2019, 06/20/2019, 03/22/2020, 09/29/2020, 02/02/2021   Pneumococcal Polysaccharide-23 10/12/2011   Pertinent  Health Maintenance Due  Topic Date Due   DEXA SCAN  Never done   INFLUENZA VACCINE  12/12/2022      05/12/2021    7:20 PM 12/05/2021    5:21 PM  Fall Risk  (RETIRED) Patient Fall Risk Level High fall risk High fall risk   Functional Status Survey:    Vitals:   02/26/23 0908 02/26/23 0918  BP: (!) 144/72 (!) 140/76  Pulse: 84   Resp: 16   Temp: (!) 97.5 F (36.4 C)   SpO2: 97%   Weight: 104 lb 6.4 oz (47.4 kg)   Height: 5\' 6"  (1.676 m)    Body mass index is 16.85 kg/m. Physical Exam Constitutional:      Comments: Thin, frail, lying in bed.  Cardiovascular:     Rate and Rhythm: Normal rate.     Pulses: Normal pulses.  Pulmonary:     Effort: Pulmonary effort is normal.  Abdominal:     General: Abdomen is flat.     Palpations: Abdomen is soft.  Musculoskeletal:     Comments: Left hip contracture tender to palpation.  Neurological:     Mental Status: Mental status is at baseline. She is disoriented.     Labs reviewed: Recent Labs    03/29/22 0000 08/14/22 0850 08/15/22 0000  NA 138 136 137  K 4.5 4.8 4.6  CL 106 103 102  CO2 26* 24 27*  GLUCOSE  --  124*  --   BUN 22* 25* 23*  CREATININE 0.9 0.79 0.8  CALCIUM 9.1 9.3 9.4   Recent Labs    03/29/22 0000 08/15/22 0000  AST 30 22  ALT 11 11  ALKPHOS 86 66  ALBUMIN 3.7 3.4*   Recent Labs    05/09/22 0000 06/17/22 0000 08/14/22 0850 08/15/22 0000  WBC 7.6 6.5 8.2 6.9  NEUTROABS 4,697.00 3,211.00  --  5,127.00  HGB 8.2*  9.7* 11.3* 10.7*  HCT 28* 31* 36.0 34*  MCV  --   --  86.7  --   PLT 365 373 369 369   Lab Results  Component Value Date   TSH 1.78 01/14/2014   No results found for: "HGBA1C" No results found for: "CHOL", "HDL", "LDLCALC", "LDLDIRECT", "TRIG", "CHOLHDL"  Significant Diagnostic Results in last 30 days:  No results found.  Assessment/Plan Severe late onset Alzheimer's dementia without behavioral disturbance, psychotic disturbance, mood disturbance, or anxiety (HCC)  Hospice care patient  Closed dislocation of left hip, subsequent encounter  Chronic idiopathic constipation  Iron deficiency anemia, unspecified iron deficiency anemia type  Weight loss Patient with advanced dementia.  Requires total care for ADLs.  She has had some weight loss with a low BMI of 16.85 at this time.  Constipation well-controlled at this time.  She is currently under hospice given slow chronic decline in recent months.  Left hip dislocation likely secondary to greater than 53 year old hip replacement.  Nonsurgical management comfort measures only.  Continue pain medication as tolerated with scheduled tramadol and as needed morphine.  Trazodone 50 nightly for insomnia.  Lexapro for mood disorder.  Continue medications while patient is still swallowing food and drink.  Family/ staff Communication: Nursing  Labs/tests ordered: None

## 2023-02-27 ENCOUNTER — Encounter: Payer: Self-pay | Admitting: Student

## 2023-03-26 ENCOUNTER — Other Ambulatory Visit: Payer: Self-pay | Admitting: Adult Health

## 2023-03-26 DIAGNOSIS — G8929 Other chronic pain: Secondary | ICD-10-CM

## 2023-03-26 MED ORDER — TRAMADOL HCL 50 MG PO TABS
50.0000 mg | ORAL_TABLET | Freq: Three times a day (TID) | ORAL | 0 refills | Status: DC | PRN
Start: 2023-03-26 — End: 2023-04-03

## 2023-04-03 ENCOUNTER — Other Ambulatory Visit: Payer: Self-pay | Admitting: Nurse Practitioner

## 2023-04-03 DIAGNOSIS — G8929 Other chronic pain: Secondary | ICD-10-CM

## 2023-04-03 MED ORDER — TRAMADOL HCL 50 MG PO TABS: 50.0000 mg | ORAL_TABLET | Freq: Three times a day (TID) | ORAL | 0 refills | Status: DC | PRN

## 2023-05-01 ENCOUNTER — Non-Acute Institutional Stay (SKILLED_NURSING_FACILITY): Payer: Medicare Other | Admitting: Nurse Practitioner

## 2023-05-01 ENCOUNTER — Encounter: Payer: Self-pay | Admitting: Nurse Practitioner

## 2023-05-01 DIAGNOSIS — D509 Iron deficiency anemia, unspecified: Secondary | ICD-10-CM

## 2023-05-01 DIAGNOSIS — G301 Alzheimer's disease with late onset: Secondary | ICD-10-CM | POA: Diagnosis not present

## 2023-05-01 DIAGNOSIS — R634 Abnormal weight loss: Secondary | ICD-10-CM

## 2023-05-01 DIAGNOSIS — K5904 Chronic idiopathic constipation: Secondary | ICD-10-CM

## 2023-05-01 DIAGNOSIS — G8929 Other chronic pain: Secondary | ICD-10-CM

## 2023-05-01 DIAGNOSIS — M25552 Pain in left hip: Secondary | ICD-10-CM

## 2023-05-01 DIAGNOSIS — F39 Unspecified mood [affective] disorder: Secondary | ICD-10-CM

## 2023-05-01 DIAGNOSIS — F02C0 Dementia in other diseases classified elsewhere, severe, without behavioral disturbance, psychotic disturbance, mood disturbance, and anxiety: Secondary | ICD-10-CM

## 2023-05-01 MED ORDER — TRAMADOL HCL 50 MG PO TABS: 50.0000 mg | ORAL_TABLET | Freq: Three times a day (TID) | ORAL | 0 refills | Status: DC | PRN

## 2023-05-01 NOTE — Progress Notes (Signed)
Location:  [facility] [place].  Nursing Home Room Number: [facility] [address] Place of Service:  SNF [phone]) Abbey Chatters, NP  PCP: Earnestine Mealing, MD  Patient Care Team: Earnestine Mealing, MD as PCP - General (Family Medicine)  Extended Emergency Contact Information Primary Emergency Contact: [facility] Phone: [phone] Relation: Daughter Preferred language: English Interpreter needed? No Secondary Emergency Contact: [facility] Mobile Phone: [phone] Relation: Son  Goals of care: Advanced Directive information    05/01/2023    9:38 AM  Advanced Directives  Does Patient Have a Medical Advance Directive? Yes  Type of Advance Directive Out of facility DNR (pink MOST or yellow form)  Does patient want to make changes to medical advance directive? No - Patient declined     Chief Complaint  Patient presents with   Medical Management of Chronic Issues    Medical Management of Chronic Issues.     HPI:  [name] seen today for medical management of chronic disease. Pt with advanced dementia, hypertension, hyperlipidemia, chronic left hip displacement.  She has been doing well and off hospice due to weight gain.  She stays in bed due to hip pain.  She smiles and reports she is doing well Nursing has no concerns at this time.      Past Medical History:  Diagnosis Date   Elevated cholesterol    Hypertension    Past Surgical History:  Procedure Laterality Date   arthroscopic shoulder Right 2001   BASAL CELL CARCINOMA EXCISION  2008   on nose   basal joint hand replace  2000   CATARACT EXTRACTION, BILATERAL  1999   child birth  76 and 1954   x 2   CYSTECTOMY  2009   lower back   HIP CLOSED REDUCTION Left 08/14/2022   Procedure: CLOSED REDUCTION HIP;  Surgeon: Signa Kell, MD;  Location: [facility] ORS;  Service: Orthopedics;  Laterality: Left;   PILONIDAL CYST EXCISION  1951   TOTAL HIP ARTHROPLASTY Left 12/01   TOTAL HIP ARTHROPLASTY  Right 2003    Allergies  Allergen Reactions   Etodolac Other (See Comments)    Increased BP   Tape     Outpatient Encounter Medications as of 05/01/2023  Medication Sig   acetaminophen (TYLENOL) 325 MG tablet Take 325 mg by mouth every 8 (eight) hours as needed.   acetaminophen (TYLENOL) 650 MG CR tablet Take 650 mg by mouth 3 (three) times daily.   escitalopram (LEXAPRO) 10 MG tablet Take 10 mg by mouth daily.   Morphine Sulfate, Concentrate, 5 MG/0.25ML SOLN Take 0.25 mg by mouth every 4 (four) hours as needed for severe pain.   traMADol (ULTRAM) 50 MG tablet Take 1 tablet (50 mg total) by mouth every 8 (eight) hours as needed. Take one tablet by mouth three times daily.   traZODone (DESYREL) 50 MG tablet Take 25 mg by mouth at bedtime.   ZINC OXIDE EX Apply topically. Apply to sacrum, coccyx, buttocks topically as needed for protection   hydrocortisone 2.5 % cream Apply to hemorrhoids topically three times daily as needed. (Patient not taking: Reported on 05/01/2023)   lidocaine 4 % Place 1 patch onto the skin daily as needed. (Patient not taking: Reported on 05/01/2023)   loperamide (IMODIUM A-D) 2 MG tablet Take 2 mg by mouth as needed for diarrhea or loose stools. (Patient not taking: Reported on 05/01/2023)   Magnesium Hydroxide (MILK OF MAGNESIA PO) Take 30 mLs by mouth every 12 (twelve) hours  as needed. (Patient not taking: Reported on 05/01/2023)   ondansetron (ZOFRAN) 8 MG tablet Take 8 mg by mouth every 8 (eight) hours as needed for nausea or vomiting. (Patient not taking: Reported on 05/01/2023)   polyethylene glycol (MIRALAX / GLYCOLAX) 17 g packet Take 17 g by mouth daily. (Patient not taking: Reported on 05/01/2023)   sennosides-docusate sodium (SENOKOT-S) 8.6-50 MG tablet Take 1 tablet by mouth daily as needed for constipation. (Patient not taking: Reported on 05/01/2023)   No facility-administered encounter medications on file as of 05/01/2023.    Review of Systems   Unable to perform ROS: Dementia     Immunization History  Administered Date(s) Administered   Influenza-Unspecified 02/29/2020, 02/26/2021, 02/26/2022, 03/05/2023   MODERNA COVID-19 SARS-COV-2 PEDS BIVALENT BOOSTER 6yr-33yr 03/22/2022   Moderna Covid-19 Vaccine Bivalent Booster 65yrs & up 10/09/2021, 03/22/2022   Moderna Sars-Covid-2 Vaccination 05/23/2019, 06/20/2019, 03/22/2020, 09/29/2020, 02/02/2021   Pneumococcal Polysaccharide-23 10/12/2011   Pertinent  Health Maintenance Due  Topic Date Due   DEXA SCAN  Never done   INFLUENZA VACCINE  Completed      05/12/2021    7:20 PM 12/05/2021    5:21 PM  Fall Risk  (RETIRED) Patient Fall Risk Level High fall risk High fall risk   Functional Status Survey:    Vitals:   05/01/23 0932 05/01/23 0940  BP: (!) 144/72 (!) 142/80  Pulse: 80   Temp: (!) 97.5 F (36.4 C)   SpO2: 95%   Weight: 110 lb (49.9 kg)   Height: 5\' 6"  (1.676 m)    Body mass index is 17.75 kg/m. Physical Exam Constitutional:      General: She is not in acute distress.    Appearance: She is well-developed. She is not diaphoretic.  HENT:     Head: Normocephalic and atraumatic.     Mouth/Throat:     Pharynx: No oropharyngeal exudate.  Eyes:     Conjunctiva/sclera: Conjunctivae normal.     Pupils: Pupils are equal, round, and reactive to light.  Cardiovascular:     Rate and Rhythm: Normal rate and regular rhythm.     Heart sounds: Normal heart sounds.  Pulmonary:     Effort: Pulmonary effort is normal.     Breath sounds: Normal breath sounds.  Abdominal:     General: Bowel sounds are normal.     Palpations: Abdomen is soft.  Musculoskeletal:     Cervical back: Normal range of motion and neck supple.     Right hip: Decreased range of motion.     Left hip: Decreased range of motion.     Right lower leg: No edema.     Left lower leg: No edema.  Skin:    General: Skin is warm and dry.  Neurological:     Mental Status: She is alert.     Motor:  Weakness present.     Gait: Gait abnormal.    Labs reviewed: Recent Labs    08/14/22 0850 08/15/22 0000  NA 136 137  K 4.8 4.6  CL 103 102  CO2 24 27*  GLUCOSE 124*  --   BUN 25* 23*  CREATININE 0.79 0.8  CALCIUM 9.3 9.4   Recent Labs    08/15/22 0000  AST 22  ALT 11  ALKPHOS 66  ALBUMIN 3.4*   Recent Labs    05/09/22 0000 06/17/22 0000 08/14/22 0850 08/15/22 0000  WBC 7.6 6.5 8.2 6.9  NEUTROABS 4,697.00 3,211.00  --  5,127.00  HGB 8.2* 9.7*  11.3* 10.7*  HCT 28* 31* 36.0 34*  MCV  --   --  86.7  --   PLT 365 373 369 369   Lab Results  Component Value Date   TSH 1.78 01/14/2014   No results found for: "HGBA1C" No results found for: "CHOL", "HDL", "LDLCALC", "LDLDIRECT", "TRIG", "CHOLHDL"  Significant Diagnostic Results in last 30 days:  No results found.  Assessment/Plan 1. Chronic hip pain, left Stable, stays in bed to due pain from chronic dislocation. Continue supportive care - traMADol (ULTRAM) 50 MG tablet; Take 1 tablet (50 mg total) by mouth every 8 (eight) hours as needed. Take one tablet by mouth three times daily.  Dispense: 90 tablet; Refill: 0  2. Severe late onset Alzheimer's dementia without behavioral disturbance, psychotic disturbance, mood disturbance, or anxiety (HCC) (Primary) -Stable, no acute changes in cognitive or functional status, continue supportive care.   3. Chronic idiopathic constipation Controlled on current regimen.   4. Iron deficiency anemia, unspecified iron deficiency anemia type Will continue to monitor, no on supplement  5. Mood disorder (HCC) Controlled on lexapro  6. Weight loss Stable with recent weight gain and now off hospice services.    Janene Harvey. Biagio Borg [facility] [phone]

## 2023-05-13 ENCOUNTER — Non-Acute Institutional Stay: Payer: Self-pay | Admitting: Nurse Practitioner

## 2023-05-13 ENCOUNTER — Encounter: Payer: Self-pay | Admitting: Nurse Practitioner

## 2023-05-13 DIAGNOSIS — Z0000 Encounter for general adult medical examination without abnormal findings: Secondary | ICD-10-CM

## 2023-05-13 NOTE — Patient Instructions (Signed)
  Ms. Khun , Thank you for taking time to come for your Medicare Wellness Visit. I appreciate your ongoing commitment to your health goals. Please review the following plan we discussed and let me know if I can assist you in the future.  This is a list of the screening recommended for you and due dates:  Health Maintenance  Topic Date Due   DTaP/Tdap/Td vaccine (1 - Tdap) Never done   Zoster (Shingles) Vaccine (1 of 2) Never done   Pneumonia Vaccine (2 of 2 - PCV) 10/11/2012   COVID-19 Vaccine (8 - 2024-25 season) 01/12/2023   Flu Shot  Completed   HPV Vaccine  Aged [employer]

## 2023-05-13 NOTE — Progress Notes (Signed)
 Subjective:   Kristin Woods is a 89 y.o. female who presents for Medicare Annual (Subsequent) preventive examination.  Visit Complete: In person    Cardiac Risk Factors include: advanced age (>75men, >32 women);sedentary lifestyle     Objective:    Today's Vitals   05/13/23 0917 05/13/23 0922  BP: (!) 142/80 (!) 144/72  Pulse: 80   Resp: 20   Temp: (!) 97.5 F (36.4 C)   SpO2: 95%   Weight: 110 lb (49.9 kg)   Height: 5' 6 (1.676 m)    Body mass index is 17.75 kg/m.     05/13/2023    9:21 AM 05/01/2023    9:38 AM 02/26/2023    9:17 AM 01/07/2023   12:47 PM 01/07/2023   12:42 PM 12/06/2022    3:22 PM 11/07/2022    3:14 PM  Advanced Directives  Does Patient Have a Medical Advance Directive? Yes Yes Yes Yes Yes Yes Yes  Type of Advance Directive Out of facility DNR (pink MOST or yellow form) Out of facility DNR (pink MOST or yellow form) Out of facility DNR (pink MOST or yellow form) Out of facility DNR (pink MOST or yellow form) Out of facility DNR (pink MOST or yellow form) Out of facility DNR (pink MOST or yellow form) Out of facility DNR (pink MOST or yellow form)  Does patient want to make changes to medical advance directive? No - Patient declined No - Patient declined No - Patient declined No - Guardian declined No - Patient declined No - Patient declined No - Patient declined  Pre-existing out of facility DNR order (yellow form or pink MOST form)       Yellow form placed in chart (order not valid for inpatient use)    Current Medications (verified) Outpatient Encounter Medications as of 05/13/2023  Medication Sig   acetaminophen  (TYLENOL ) 325 MG tablet Take 325 mg by mouth every 8 (eight) hours as needed.   acetaminophen  (TYLENOL ) 650 MG CR tablet Take 650 mg by mouth 3 (three) times daily.   escitalopram (LEXAPRO) 10 MG tablet Take 10 mg by mouth daily.   Morphine  Sulfate, Concentrate, 5 MG/0.25ML SOLN Take 0.25 mg by mouth every 4 (four) hours as needed for  severe pain.   traMADol  (ULTRAM ) 50 MG tablet Take 1 tablet (50 mg total) by mouth every 8 (eight) hours as needed. Take one tablet by mouth three times daily.   traZODone  (DESYREL ) 50 MG tablet Take 25 mg by mouth at bedtime.   ZINC OXIDE EX Apply topically. Apply to sacrum, coccyx, buttocks topically as needed for protection   No facility-administered encounter medications on file as of 05/13/2023.    Allergies (verified) Etodolac and Tape   History: Past Medical History:  Diagnosis Date   Elevated cholesterol    Hypertension    Past Surgical History:  Procedure Laterality Date   arthroscopic shoulder Right 2001   BASAL CELL CARCINOMA EXCISION  2008   on nose   basal joint hand replace  2000   CATARACT EXTRACTION, BILATERAL  1999   child birth  39 and 1956   x 2   CYSTECTOMY  2009   lower back   HIP CLOSED REDUCTION Left 08/14/2022   Procedure: CLOSED REDUCTION HIP;  Surgeon: Tobie Priest, MD;  Location: [facility] ORS;  Service: Orthopedics;  Laterality: Left;   PILONIDAL CYST EXCISION  1951   TOTAL HIP ARTHROPLASTY Left 12/01   TOTAL HIP ARTHROPLASTY Right 2003   History reviewed.  No pertinent family history. Social History   Socioeconomic History   Marital status: Married    Spouse name: Not on file   Number of children: Not on file   Years of education: Not on file   Highest education level: Not on file  Occupational History   Not on file  Tobacco Use   Smoking status: Former   Smokeless tobacco: Never   Tobacco comments:    quit 1956  Vaping Use   Vaping status: Never Used  Substance and Sexual Activity   Alcohol use: Not Currently    Comment: social    Drug use: No   Sexual activity: Not on file  Other Topics Concern   Not on file  Social History Narrative   Not on file   Social Drivers of Health   Financial Resource Strain: Not on file  Food Insecurity: Not on file  Transportation Needs: Not on file  Physical Activity: Not on file  Stress: Not on  file  Social Connections: Not on file    Tobacco Counseling Counseling given: Not Answered Tobacco comments: quit 1956   Clinical Intake:  Pre-visit preparation completed: Yes  Pain : No/denies pain     BMI - recorded: 17 Nutritional Status: BMI <19  Underweight Nutritional Risks: Unintentional weight loss Diabetes: No  How often do you need to have someone help you when you read instructions, pamphlets, or other written materials from your doctor or pharmacy?: 5 - Always         Activities of Daily Living    05/13/2023   11:34 AM  In your present state of health, do you have any difficulty performing the following activities:  Hearing? 1  Vision? 1  Difficulty concentrating or making decisions? 1  Walking or climbing stairs? 1  Dressing or bathing? 1  Doing errands, shopping? 1  Preparing Food and eating ? Y  Using the Toilet? Y  In the past six months, have you accidently leaked urine? Y  Do you have problems with loss of bowel control? Y  Managing your Medications? Y  Managing your Finances? Y  Housekeeping or managing your Housekeeping? Y    Patient Care Team: Abdul Fine, MD as PCP - General (Family Medicine)  Indicate any recent Medical Services you may have received from other than [facility] providers in the past year (date may be approximate).     Assessment:   This is a routine wellness examination for [name].  Hearing/Vision screen No results found.   Goals Addressed   None    Depression Screen     No data to display          Fall Risk     No data to display          MEDICARE RISK AT HOME: Medicare Risk at Home Any stairs in or around the home?: No Home free of loose throw rugs in walkways, pet beds, electrical cords, etc?: Yes Adequate lighting in your home to reduce risk of falls?: Yes Life alert?: No Use of a cane, walker or w/c?: Yes Grab bars in the bathroom?: Yes Shower chair or bench in shower?: Yes Elevated  toilet seat or a handicapped toilet?: Yes  TIMED UP AND GO:  Was the test performed?  No    Cognitive Function:        Immunizations Immunization History  Administered Date(s) Administered   Influenza-Unspecified 02/29/2020, 02/26/2021, 02/26/2022, 03/05/2023   MODERNA COVID-19 SARS-COV-2 PEDS BIVALENT BOOSTER 14yr-18yr 03/22/2022  Moderna Covid-19 Vaccine Bivalent Booster 43yrs & up 10/09/2021, 03/22/2022   Moderna Sars-Covid-2 Vaccination 05/23/2019, 06/20/2019, 03/22/2020, 09/29/2020, 02/02/2021   Pneumococcal Polysaccharide-23 10/12/2011    TDAP status: Due, Education has been provided regarding the importance of this vaccine. Advised may receive this vaccine at local pharmacy or Health Dept. Aware to provide a copy of the vaccination record if obtained from local pharmacy or Health Dept. Verbalized acceptance and understanding.  Flu Vaccine status: Up to date  Pneumococcal vaccine status: Due, Education has been provided regarding the importance of this vaccine. Advised may receive this vaccine at local pharmacy or Health Dept. Aware to provide a copy of the vaccination record if obtained from local pharmacy or Health Dept. Verbalized acceptance and understanding.  Covid-19 vaccine status: Information provided on how to obtain vaccines.   Qualifies for Shingles Vaccine? No   Zostavax completed No   Shingrix Completed?: No.    Education has been provided regarding the importance of this vaccine. Patient has been advised to call insurance company to determine out of pocket expense if they have not yet received this vaccine. Advised may also receive vaccine at local pharmacy or Health Dept. Verbalized acceptance and understanding.  Screening Tests Health Maintenance  Topic Date Due   DTaP/Tdap/Td (1 - Tdap) Never done   Zoster Vaccines- Shingrix (1 of 2) Never done   Pneumonia Vaccine 88+ Years old (2 of 2 - PCV) 10/11/2012   COVID-19 Vaccine (8 - 2024-25 season) 01/12/2023    INFLUENZA VACCINE  Completed   HPV VACCINES  Aged Out    Health Maintenance  Health Maintenance Due  Topic Date Due   DTaP/Tdap/Td (1 - Tdap) Never done   Zoster Vaccines- Shingrix (1 of 2) Never done   Pneumonia Vaccine 32+ Years old (2 of 2 - PCV) 10/11/2012   COVID-19 Vaccine (8 - 2024-25 season) 01/12/2023    Colorectal cancer screening: No longer required.   Mammogram status: No longer required due to age.   Lung Cancer Screening: (Low Dose CT Chest recommended if Age 37-80 years, 20 pack-year currently smoking OR have quit w/in 15years.) does not qualify.   Lung Cancer Screening Referral: na  Additional Screening:  Hepatitis C Screening: does not qualify  Vision Screening: Recommended annual ophthalmology exams for early detection of glaucoma and other disorders of the eye. Is the patient up to date with their annual eye exam?  No  Unable to complete due to dementia   Dental Screening: Recommended annual dental exams for proper oral hygiene  Community Resource Referral / Chronic Care Management: CRR required this visit?  No   CCM required this visit?  No     Plan:     I have personally reviewed and noted the following in the patient's chart:   Medical and social history Use of alcohol, tobacco or illicit drugs  Current medications and supplements including opioid prescriptions. Patient is currently taking opioid prescriptions. Information provided to patient regarding non-opioid alternatives. Patient advised to discuss non-opioid treatment plan with their provider. Functional ability and status Nutritional status Physical activity Advanced directives List of other physicians Hospitalizations, surgeries, and ER visits in previous 12 months Vitals Screenings to include cognitive, depression, and falls Referrals and appointments  In addition, I have reviewed and discussed with patient certain preventive protocols, quality metrics, and best practice  recommendations. A written personalized care plan for preventive services as well as general preventive health recommendations were provided to patient.     Harlene MARLA An,  NP   05/13/2023

## 2023-06-03 ENCOUNTER — Other Ambulatory Visit: Payer: Self-pay | Admitting: Nurse Practitioner

## 2023-06-03 DIAGNOSIS — G8929 Other chronic pain: Secondary | ICD-10-CM

## 2023-06-03 MED ORDER — TRAMADOL HCL 50 MG PO TABS: 50.0000 mg | ORAL_TABLET | Freq: Three times a day (TID) | ORAL | 0 refills | Status: DC | PRN

## 2023-06-18 ENCOUNTER — Non-Acute Institutional Stay (SKILLED_NURSING_FACILITY): Payer: Self-pay | Admitting: Student

## 2023-06-18 ENCOUNTER — Encounter: Payer: Self-pay | Admitting: Student

## 2023-06-18 DIAGNOSIS — S73005D Unspecified dislocation of left hip, subsequent encounter: Secondary | ICD-10-CM

## 2023-06-18 DIAGNOSIS — G301 Alzheimer's disease with late onset: Secondary | ICD-10-CM | POA: Diagnosis not present

## 2023-06-18 DIAGNOSIS — F02C0 Dementia in other diseases classified elsewhere, severe, without behavioral disturbance, psychotic disturbance, mood disturbance, and anxiety: Secondary | ICD-10-CM

## 2023-06-18 DIAGNOSIS — K5904 Chronic idiopathic constipation: Secondary | ICD-10-CM

## 2023-06-18 NOTE — Progress Notes (Signed)
 Location:  Other [place].  Nursing Home Room Number: [facility] [address] Place of Service:  SNF [phone]) Provider:  Abdul Fine, MD  Patient Care Team: Abdul Fine, MD as PCP - General (Family Medicine)  Extended Emergency Contact Information Primary Emergency Contact: [facility] Phone: [phone] Relation: Daughter Preferred language: English Interpreter needed? No Secondary Emergency Contact: [facility] Mobile Phone: [phone] Relation: Son  Code Status:  DNR Goals of care: Advanced Directive information    06/18/2023   10:32 AM  Advanced Directives  Does Patient Have a Medical Advance Directive? Yes  Type of Advance Directive Out of facility DNR (pink MOST or yellow form)  Does patient want to make changes to medical advance directive? No - Patient declined     Chief Complaint  Patient presents with   Medical Management of Chronic Issues    Medical Management of Chronic Issues.     HPI:  Pt is a 89 y.o. Woods seen today for medical management of chronic diseases.   Patient with history of advance dementia. Disoriented at baseline. Feeds herself, however, does not finish all of her meals. Primarily remains in bed most days secondary to pain with movement from hip dislocation. Contracture formed. She is awake and pleasant. Denies pain. States she doesn't live in [place] and she has never heard of [facility] without acute concerns. Previously on hospice, however, no acute signs of decline and was discharged from their service. No interventions per goals of care with family members. Comfort measures only.   Past Medical History:  Diagnosis Date   Elevated cholesterol    Hypertension    Past Surgical History:  Procedure Laterality Date   arthroscopic shoulder Right 2001   BASAL CELL CARCINOMA EXCISION  2008   on nose   basal joint hand replace  2000   CATARACT EXTRACTION, BILATERAL  1999   child birth  29 and 1954   x 2    CYSTECTOMY  2009   lower back   HIP CLOSED REDUCTION Left 08/14/2022   Procedure: CLOSED REDUCTION HIP;  Surgeon: Tobie Priest, MD;  Location: [facility] ORS;  Service: Orthopedics;  Laterality: Left;   PILONIDAL CYST EXCISION  1951   TOTAL HIP ARTHROPLASTY Left 12/01   TOTAL HIP ARTHROPLASTY Right 2003    Allergies  Allergen Reactions   Etodolac Other (See Comments)    Increased BP   Tape     Outpatient Encounter Medications as of 06/18/2023  Medication Sig   acetaminophen  (TYLENOL ) 325 MG tablet Take 325 mg by mouth every 8 (eight) hours as needed.   acetaminophen  (TYLENOL ) 650 MG CR tablet Take 650 mg by mouth 3 (three) times daily.   escitalopram (LEXAPRO) 10 MG tablet Take 10 mg by mouth daily.   Morphine  Sulfate, Concentrate, 5 MG/0.25ML SOLN Take 0.25 mg by mouth every 4 (four) hours as needed for severe pain.   traMADol  (ULTRAM ) 50 MG tablet Take 1 tablet (50 mg total) by mouth every 8 (eight) hours as needed. Take one tablet by mouth three times daily.   ZINC OXIDE EX Apply topically. Apply to sacrum, coccyx, buttocks topically as needed for protection   traZODone  (DESYREL ) 50 MG tablet Take 25 mg by mouth at bedtime. (Patient not taking: Reported on 06/18/2023)   No facility-administered encounter medications on file as of 06/18/2023.    Review of Systems  Immunization History  Administered Date(s) Administered   Influenza-Unspecified 02/29/2020, 02/26/2021, 02/26/2022, 03/05/2023   MODERNA COVID-19 SARS-COV-2 PEDS BIVALENT BOOSTER  35yr-56yr 03/22/2022   Moderna Covid-19 Vaccine Bivalent Booster 71yrs & up 10/09/2021, 03/22/2022   Moderna Sars-Covid-2 Vaccination 05/23/2019, 06/20/2019, 03/22/2020, 09/29/2020, 02/02/2021   PNEUMOCOCCAL CONJUGATE-20 05/14/2023   Pneumococcal Polysaccharide-23 10/12/2011   Pertinent  Health Maintenance Due  Topic Date Due   INFLUENZA VACCINE  Completed      05/12/2021    7:20 PM 12/05/2021    5:21 PM  Fall Risk  (RETIRED) Patient Fall Risk  Level High fall risk High fall risk   Functional Status Survey:    Vitals:   06/18/23 1027 06/18/23 1034  BP: (!) 152/74 (!) 107/58  Pulse: 66   Resp: 20   Temp: 98.1 F (36.7 C)   SpO2: 96%   Weight: 114 lb (51.7 kg)   Height: 5' 6 (1.676 m)    Body mass index is 18.4 kg/m. Physical Exam Constitutional:      Comments: Thin, frail  Cardiovascular:     Rate and Rhythm: Normal rate.  Pulmonary:     Effort: Pulmonary effort is normal.  Musculoskeletal:     Comments: leftHip contracture  Skin:    General: Skin is warm.  Neurological:     Mental Status: She is alert. Mental status is at baseline. She is disoriented.     Labs reviewed: Recent Labs    08/14/22 0850 08/15/22 0000  NA 136 137  K 4.8 4.6  CL 103 102  CO2 24 27*  GLUCOSE 124*  --   BUN 25* 23*  CREATININE 0.79 0.8  CALCIUM 9.3 9.4   Recent Labs    08/15/22 0000  AST 22  ALT 11  ALKPHOS 66  ALBUMIN 3.4*   Recent Labs    08/14/22 0850 08/15/22 0000  WBC 8.2 6.9  NEUTROABS  --  5,127.00  HGB 11.3* 10.7*  HCT 36.0 34*  MCV 86.7  --   PLT 369 369   Lab Results  Component Value Date   TSH 1.78 01/14/2014   No results found for: HGBA1C No results found for: CHOL, HDL, LDLCALC, LDLDIRECT, TRIG, CHOLHDL  Significant Diagnostic Results in last 30 days:  No results found.  Assessment/Plan Chronic idiopathic constipation  Severe late onset Alzheimer's dementia without behavioral disturbance, psychotic disturbance, mood disturbance, or anxiety (HCC), Chronic  Closed dislocation of left hip, subsequent encounter Patient is pleasant and without pain at this time. No acute concerns for constipation. Dementia stage 7A at this time. Weight is stable. Low socialization due to immobility of lower extremities. Encourage individual visits with activities coordinators. No interventions for hip dislocation. Pain control as written.   Family/ staff Communication: nursing  Labs/tests  ordered:  none

## 2023-07-07 ENCOUNTER — Other Ambulatory Visit: Payer: Self-pay | Admitting: Nurse Practitioner

## 2023-07-07 ENCOUNTER — Other Ambulatory Visit: Payer: Self-pay | Admitting: Adult Health

## 2023-07-07 DIAGNOSIS — G8929 Other chronic pain: Secondary | ICD-10-CM

## 2023-07-07 MED ORDER — TRAMADOL HCL 50 MG PO TABS: 50.0000 mg | ORAL_TABLET | Freq: Three times a day (TID) | ORAL | 0 refills | Status: DC

## 2023-07-07 NOTE — Telephone Encounter (Signed)
SNF resident.  

## 2023-08-04 ENCOUNTER — Other Ambulatory Visit: Payer: Self-pay | Admitting: Student

## 2023-08-04 DIAGNOSIS — G8929 Other chronic pain: Secondary | ICD-10-CM

## 2023-08-04 MED ORDER — TRAMADOL HCL 50 MG PO TABS: 50.0000 mg | ORAL_TABLET | Freq: Three times a day (TID) | ORAL | 0 refills | Status: DC

## 2023-08-04 NOTE — Progress Notes (Signed)
Refill of chronic pain medication.

## 2023-08-05 ENCOUNTER — Encounter: Payer: Self-pay | Admitting: Student

## 2023-08-05 ENCOUNTER — Encounter: Payer: Self-pay | Admitting: Nurse Practitioner

## 2023-08-05 ENCOUNTER — Non-Acute Institutional Stay (SKILLED_NURSING_FACILITY): Payer: Self-pay | Admitting: Nurse Practitioner

## 2023-08-05 DIAGNOSIS — M25552 Pain in left hip: Secondary | ICD-10-CM

## 2023-08-05 DIAGNOSIS — G8929 Other chronic pain: Secondary | ICD-10-CM

## 2023-08-05 DIAGNOSIS — G301 Alzheimer's disease with late onset: Secondary | ICD-10-CM | POA: Diagnosis not present

## 2023-08-05 DIAGNOSIS — F02C0 Dementia in other diseases classified elsewhere, severe, without behavioral disturbance, psychotic disturbance, mood disturbance, and anxiety: Secondary | ICD-10-CM

## 2023-08-05 DIAGNOSIS — R634 Abnormal weight loss: Secondary | ICD-10-CM

## 2023-08-05 DIAGNOSIS — F39 Unspecified mood [affective] disorder: Secondary | ICD-10-CM | POA: Diagnosis not present

## 2023-08-05 NOTE — Progress Notes (Signed)
 Location:  Other [place].  Nursing Home Room Number: [facility] [address] Place of Service:  SNF [phone]) Abbey Chatters, NP  PCP: Earnestine Mealing, MD  Patient Care Team: Earnestine Mealing, MD as PCP - General (Family Medicine)  Extended Emergency Contact Information Primary Emergency Contact: [facility] Phone: [phone] Relation: Daughter Preferred language: English Interpreter needed? No Secondary Emergency Contact: [facility] Mobile Phone: [phone] Relation: Son  Goals of care: Advanced Directive information    06/18/2023   10:32 AM  Advanced Directives  Does Patient Have a Medical Advance Directive? Yes  Type of Advance Directive Out of facility DNR (pink MOST or yellow form)  Does patient want to make changes to medical advance directive? No - Patient declined     Chief Complaint  Patient presents with   Medical Management of Chronic Issues    Medical Management of Chronic Issues.     HPI:  Pt is a 89 y.o. female seen today for medical management of chronic disease.   Previously on hospice due to chronic dislocation or hip with dementia and weight loss, however now off of hospice services due to weight gain. Her weight has been steady, she is currently 113 lbs, and on 06/18/2023 she was 114 lbs.   At the time of this visit she was in a pleasant mood and cooperative. She was noted to have finished eating her lunch and ate about 50 % of her meal.   Her left leg remains contracted and she complains of pain with movement to left leg.   Per nursing staff she is taking her scheduled Tramadol, Tylenol, and Lexapro with no issues. She is not requiring PRN Morphine for breakthrough pain very frequently.  Past Medical History:  Diagnosis Date   Elevated cholesterol    Hypertension    Past Surgical History:  Procedure Laterality Date   arthroscopic shoulder Right 2001   BASAL CELL CARCINOMA EXCISION  2008   on nose   basal joint hand replace  2000    CATARACT EXTRACTION, BILATERAL  1999   child birth  66 and 1954   x 2   CYSTECTOMY  2009   lower back   HIP CLOSED REDUCTION Left 08/14/2022   Procedure: CLOSED REDUCTION HIP;  Surgeon: Signa Kell, MD;  Location: [facility] ORS;  Service: Orthopedics;  Laterality: Left;   PILONIDAL CYST EXCISION  1951   TOTAL HIP ARTHROPLASTY Left 12/01   TOTAL HIP ARTHROPLASTY Right 2003    Allergies  Allergen Reactions   Etodolac Other (See Comments)    Increased BP   Tape     Outpatient Encounter Medications as of 08/05/2023  Medication Sig   acetaminophen (TYLENOL) 325 MG tablet Take 325 mg by mouth every 8 (eight) hours as needed.   acetaminophen (TYLENOL) 650 MG CR tablet Take 650 mg by mouth 3 (three) times daily.   escitalopram (LEXAPRO) 10 MG tablet Take 10 mg by mouth daily.   Morphine Sulfate, Concentrate, 5 MG/0.25ML SOLN Take 0.25 mg by mouth every 4 (four) hours as needed for severe pain.   traMADol (ULTRAM) 50 MG tablet Take 50 mg by mouth 3 (three) times daily.   ZINC OXIDE EX Apply topically. Apply to sacrum, coccyx, buttocks topically as needed for protection   [DISCONTINUED] traMADol (ULTRAM) 50 MG tablet Take 1 tablet (50 mg total) by mouth 3 (three) times daily. Take one tablet by mouth three times daily. (Patient taking differently: Take 50 mg by mouth 3 (three) times daily.)   No  facility-administered encounter medications on file as of 08/05/2023.    Review of Systems  Unable to perform ROS: Dementia     Immunization History  Administered Date(s) Administered   Influenza-Unspecified 02/29/2020, 02/26/2021, 02/26/2022, 03/05/2023   MODERNA COVID-19 SARS-COV-2 PEDS BIVALENT BOOSTER 68yr-73yr 03/22/2022   Moderna Covid-19 Vaccine Bivalent Booster 71yrs & up 10/09/2021, 03/22/2022   Moderna Sars-Covid-2 Vaccination 05/23/2019, 06/20/2019, 03/22/2020, 09/29/2020, 02/02/2021   PNEUMOCOCCAL CONJUGATE-20 05/14/2023   Pneumococcal Polysaccharide-23 10/12/2011   Pertinent   Health Maintenance Due  Topic Date Due   INFLUENZA VACCINE  Completed      05/12/2021    7:20 PM 12/05/2021    5:21 PM  Fall Risk  (RETIRED) Patient Fall Risk Level High fall risk High fall risk   Functional Status Survey:    Vitals:   08/05/23 1247  BP: 108/70  Pulse: 62  Resp: 18  Temp: 98 F (36.7 C)  SpO2: 96%  Weight: 113 lb 3.2 oz (51.3 kg)  Height: 5\' 6"  (1.676 m)   Body mass index is 18.27 kg/m. Physical Exam Vitals reviewed.  Constitutional:      Appearance: Normal appearance.  HENT:     Head: Normocephalic and atraumatic.     Mouth/Throat:     Mouth: Mucous membranes are moist.     Pharynx: Oropharynx is clear.  Eyes:     Conjunctiva/sclera: Conjunctivae normal.  Cardiovascular:     Rate and Rhythm: Normal rate and regular rhythm.     Pulses: Normal pulses.     Heart sounds: Murmur heard.  Pulmonary:     Effort: Pulmonary effort is normal.     Breath sounds: Normal breath sounds.  Abdominal:     General: Bowel sounds are normal.     Palpations: Abdomen is soft.  Musculoskeletal:     Left hip: Tenderness present.     Comments: Left leg contracted  Skin:    General: Skin is warm and dry.  Neurological:     Mental Status: She is alert. Mental status is at baseline.  Psychiatric:        Mood and Affect: Mood and affect normal.        Speech: Speech normal.        Behavior: Behavior normal. Behavior is cooperative.        Cognition and Memory: Cognition is not impaired. Memory is not impaired.    Labs reviewed: Recent Labs    08/14/22 0850 08/15/22 0000  NA 136 137  K 4.8 4.6  CL 103 102  CO2 24 27*  GLUCOSE 124*  --   BUN 25* 23*  CREATININE 0.79 0.8  CALCIUM 9.3 9.4   Recent Labs    08/15/22 0000  AST 22  ALT 11  ALKPHOS 66  ALBUMIN 3.4*   Recent Labs    08/14/22 0850 08/15/22 0000  WBC 8.2 6.9  NEUTROABS  --  5,127.00  HGB 11.3* 10.7*  HCT 36.0 34*  MCV 86.7  --   PLT 369 369   Lab Results  Component Value Date    TSH 1.78 01/14/2014   No results found for: "HGBA1C" No results found for: "CHOL", "HDL", "LDLCALC", "LDLDIRECT", "TRIG", "CHOLHDL"  Significant Diagnostic Results in last 30 days:  No results found.  Assessment/Plan 1. Chronic pain of left hip chronic hip dislocation with chronic left hip pain managed with scheduled Tylenol, Tramadol, and PRN Morphine, continue all as prescribed  - Patient is bed bound due to left hip pain and left leg  remains contracted  2. Severe late onset Alzheimer's dementia without behavioral disturbance, psychotic disturbance, mood disturbance, or anxiety (HCC) Stable, At baseline, mood has been stable - Patient currently in pleasant and cooperative mood  3. Mood disorder (HCC) - Stable, continue Lexapro 10 mg tablet daily  4. Weight loss - Stable, her weight has been steady, she is currently 113 lbs, and on 06/18/2023 she was 114 lbs. - Per nursing staff she is eating about 50% of her meals  Irfat Magnus Ivan, Charity fundraiser, DNP - AGPCNP student at [employer] -I personally was present during the history, physical exam and medical decision-making activities of this service and have verified that the service and findings are accurately documented in the student's note Keylani Perlstein K. Biagio Borg [facility] [phone]

## 2023-08-05 NOTE — Progress Notes (Deleted)
 Location:  Other [place].  Nursing Home Room Number: [facility] [address]A Place of Service:  SNF [phone]) Abbey Chatters, NP  PCP: Earnestine Mealing, MD  Patient Care Team: Earnestine Mealing, MD as PCP - General (Family Medicine)  Extended Emergency Contact Information Primary Emergency Contact: [facility] Phone: [phone] Relation: Daughter Preferred language: English Interpreter needed? No Secondary Emergency Contact: [facility] Mobile Phone: [phone] Relation: Son  Goals of care: Advanced Directive information    06/18/2023   10:32 AM  Advanced Directives  Does Patient Have a Medical Advance Directive? Yes  Type of Advance Directive Out of facility DNR (pink MOST or yellow form)  Does patient want to make changes to medical advance directive? No - Patient declined     Chief Complaint  Patient presents with   Medical Management of Chronic Issues    Medical Management of Chronic Issues.     HPI:  Pt is a 89 y.o. female seen today for medical management of chronic disease.    Past Medical History:  Diagnosis Date   Elevated cholesterol    Hypertension    Past Surgical History:  Procedure Laterality Date   arthroscopic shoulder Right 2001   BASAL CELL CARCINOMA EXCISION  2008   on nose   basal joint hand replace  2000   CATARACT EXTRACTION, BILATERAL  1999   child birth  78 and 1954   x 2   CYSTECTOMY  2009   lower back   HIP CLOSED REDUCTION Left 08/14/2022   Procedure: CLOSED REDUCTION HIP;  Surgeon: Signa Kell, MD;  Location: [facility] ORS;  Service: Orthopedics;  Laterality: Left;   PILONIDAL CYST EXCISION  1951   TOTAL HIP ARTHROPLASTY Left 12/01   TOTAL HIP ARTHROPLASTY Right 2003    Allergies  Allergen Reactions   Etodolac Other (See Comments)    Increased BP   Tape     Outpatient Encounter Medications as of 08/05/2023  Medication Sig   acetaminophen (TYLENOL) 325 MG tablet Take 325 mg by mouth every 8 (eight) hours as needed.    acetaminophen (TYLENOL) 650 MG CR tablet Take 650 mg by mouth 3 (three) times daily.   escitalopram (LEXAPRO) 10 MG tablet Take 10 mg by mouth daily.   Morphine Sulfate, Concentrate, 5 MG/0.25ML SOLN Take 0.25 mg by mouth every 4 (four) hours as needed for severe pain.   traMADol (ULTRAM) 50 MG tablet Take 50 mg by mouth 3 (three) times daily.   ZINC OXIDE EX Apply topically. Apply to sacrum, coccyx, buttocks topically as needed for protection   [DISCONTINUED] traMADol (ULTRAM) 50 MG tablet Take 1 tablet (50 mg total) by mouth 3 (three) times daily. Take one tablet by mouth three times daily. (Patient taking differently: Take 50 mg by mouth 3 (three) times daily.)   No facility-administered encounter medications on file as of 08/05/2023.    Review of Systems ***  Immunization History  Administered Date(s) Administered   Influenza-Unspecified 02/29/2020, 02/26/2021, 02/26/2022, 03/05/2023   MODERNA COVID-19 SARS-COV-2 PEDS BIVALENT BOOSTER 67yr-94yr 03/22/2022   Moderna Covid-19 Vaccine Bivalent Booster 46yrs & up 10/09/2021, 03/22/2022   Moderna Sars-Covid-2 Vaccination 05/23/2019, 06/20/2019, 03/22/2020, 09/29/2020, 02/02/2021   PNEUMOCOCCAL CONJUGATE-20 05/14/2023   Pneumococcal Polysaccharide-23 10/12/2011   Pertinent  Health Maintenance Due  Topic Date Due   INFLUENZA VACCINE  Completed      05/12/2021    7:20 PM 12/05/2021    5:21 PM  Fall Risk  (RETIRED) Patient Fall Risk Level High fall risk High fall  risk   Functional Status Survey:    Vitals:   08/05/23 1247  BP: 108/70  Pulse: 62  Resp: 18  Temp: 98 F (36.7 C)  SpO2: 96%  Weight: 113 lb 3.2 oz (51.3 kg)  Height: 5\' 6"  (1.676 m)   Body mass index is 18.27 kg/m. Physical Exam***  Labs reviewed: Recent Labs    08/14/22 0850 08/15/22 0000  NA 136 137  K 4.8 4.6  CL 103 102  CO2 24 27*  GLUCOSE 124*  --   BUN 25* 23*  CREATININE 0.79 0.8  CALCIUM 9.3 9.4   Recent Labs    08/15/22 0000  AST 22   ALT 11  ALKPHOS 66  ALBUMIN 3.4*   Recent Labs    08/14/22 0850 08/15/22 0000  WBC 8.2 6.9  NEUTROABS  --  5,127.00  HGB 11.3* 10.7*  HCT 36.0 34*  MCV 86.7  --   PLT 369 369   Lab Results  Component Value Date   TSH 1.78 01/14/2014   No results found for: "HGBA1C" No results found for: "CHOL", "HDL", "LDLCALC", "LDLDIRECT", "TRIG", "CHOLHDL"  Significant Diagnostic Results in last 30 days:  No results found.  Assessment/Plan No problem-specific Assessment & Plan notes found for this encounter.     Janene Harvey. [facility] [phone]

## 2023-08-05 NOTE — Progress Notes (Signed)
error 

## 2023-09-03 ENCOUNTER — Other Ambulatory Visit: Payer: Self-pay | Admitting: Student

## 2023-09-03 DIAGNOSIS — G8929 Other chronic pain: Secondary | ICD-10-CM

## 2023-09-03 MED ORDER — TRAMADOL HCL 50 MG PO TABS: 50.0000 mg | ORAL_TABLET | Freq: Three times a day (TID) | ORAL | 0 refills | Status: DC

## 2023-09-03 NOTE — Progress Notes (Signed)
 Refill chronic pain medication

## 2023-09-17 ENCOUNTER — Encounter: Payer: Self-pay | Admitting: Student

## 2023-09-17 ENCOUNTER — Non-Acute Institutional Stay (SKILLED_NURSING_FACILITY): Payer: Self-pay | Admitting: Student

## 2023-09-17 DIAGNOSIS — F02C0 Dementia in other diseases classified elsewhere, severe, without behavioral disturbance, psychotic disturbance, mood disturbance, and anxiety: Secondary | ICD-10-CM

## 2023-09-17 DIAGNOSIS — K5904 Chronic idiopathic constipation: Secondary | ICD-10-CM | POA: Diagnosis not present

## 2023-09-17 DIAGNOSIS — S73005D Unspecified dislocation of left hip, subsequent encounter: Secondary | ICD-10-CM | POA: Diagnosis not present

## 2023-09-17 DIAGNOSIS — G301 Alzheimer's disease with late onset: Secondary | ICD-10-CM

## 2023-09-17 NOTE — Progress Notes (Unsigned)
 Location:  Other [place].  Nursing Home Room Number: [facility] [address] Place of Service:  SNF [phone]) Provider:  Valrie Gehrig, MD  Patient Care Team: Valrie Gehrig, MD as PCP - General (Family Medicine)  Extended Emergency Contact Information Primary Emergency Contact: [facility] Phone: [phone] Relation: Daughter Preferred language: English Interpreter needed? No Secondary Emergency Contact: [facility] Mobile Phone: [phone] Relation: Son  Code Status:  DNR Goals of care: Advanced Directive information    09/17/2023   12:50 PM  Advanced Directives  Does Patient Have a Medical Advance Directive? Yes  Type of Advance Directive Out of facility DNR (pink MOST or yellow form)  Does patient want to make changes to medical advance directive? No - Patient declined     Chief Complaint  Patient presents with   Medical Management of Chronic Issues    Medical Management of Chronic Issues.     HPI:  Pt is a 89 y.o. female seen today for medical management of chronic diseases.  Is lying in bed and states she is well. Denies pain. She doesn't know where she is, why she is here, or who I am.   Spoke with nursing who have no concerns. Patient is bedridden the majority of the time secondary to hip dislocation and non-surgical management. She has continued pain and periodically yells out due to the pain. She is on a schedule of going to meals outside of the room and has limited participation due to progression of dementia. She is totally dependent for all ADLs, however, she can feed herself.    Past Medical History:  Diagnosis Date   Elevated cholesterol    Hypertension    Past Surgical History:  Procedure Laterality Date   arthroscopic shoulder Right 2001   BASAL CELL CARCINOMA EXCISION  2008   on nose   basal joint hand replace  2000   CATARACT EXTRACTION, BILATERAL  1999   child birth  67 and 1954   x 2   CYSTECTOMY  2009   lower back   HIP  CLOSED REDUCTION Left 08/14/2022   Procedure: CLOSED REDUCTION HIP;  Surgeon: Lorri Rota, MD;  Location: [facility] ORS;  Service: Orthopedics;  Laterality: Left;   PILONIDAL CYST EXCISION  1951   TOTAL HIP ARTHROPLASTY Left 12/01   TOTAL HIP ARTHROPLASTY Right 2003    Allergies  Allergen Reactions   Etodolac Other (See Comments)    Increased BP   Tape     Outpatient Encounter Medications as of 09/17/2023  Medication Sig   acetaminophen  (TYLENOL ) 325 MG tablet Take 325 mg by mouth every 8 (eight) hours as needed.   acetaminophen  (TYLENOL ) 650 MG CR tablet Take 650 mg by mouth 3 (three) times daily.   escitalopram (LEXAPRO) 10 MG tablet Take 10 mg by mouth daily.   Morphine  Sulfate, Concentrate, 5 MG/0.25ML SOLN Take 0.25 mg by mouth every 4 (four) hours as needed for severe pain.   traMADol  (ULTRAM ) 50 MG tablet Take 1 tablet (50 mg total) by mouth 3 (three) times daily.   ZINC OXIDE EX Apply topically. Apply to sacrum, coccyx, buttocks topically as needed for protection   No facility-administered encounter medications on file as of 09/17/2023.    Review of Systems  Immunization History  Administered Date(s) Administered   Influenza-Unspecified 02/29/2020, 02/26/2021, 02/26/2022, 03/05/2023   MODERNA COVID-19 SARS-COV-2 PEDS BIVALENT BOOSTER 40yr-36yr 03/22/2022   Moderna Covid-19 Vaccine Bivalent Booster 62yrs & up 10/09/2021, 03/22/2022   Moderna Sars-Covid-2 Vaccination 05/23/2019, 06/20/2019, 03/22/2020, 09/29/2020,  02/02/2021   PNEUMOCOCCAL CONJUGATE-20 05/14/2023   Pneumococcal Polysaccharide-23 10/12/2011   Pertinent  Health Maintenance Due  Topic Date Due   INFLUENZA VACCINE  12/12/2023      05/12/2021    7:20 PM 12/05/2021    5:21 PM  Fall Risk  (RETIRED) Patient Fall Risk Level High fall risk High fall risk   Functional Status Survey:    Vitals:   09/17/23 1246  BP: (!) 106/58  Pulse: 74  Resp: 16  Temp: 98.6 F (37 C)  SpO2: 95%  Weight: 110 lb (49.9 kg)   Height: 5\' 6"  (1.676 m)   Body mass index is 17.75 kg/m. Physical Exam Constitutional:      Comments: Lying in bed  Cardiovascular:     Rate and Rhythm: Normal rate.  Pulmonary:     Effort: Pulmonary effort is normal.  Abdominal:     General: Abdomen is flat.  Musculoskeletal:     Comments: Contracture of left hip  Neurological:     Mental Status: She is disoriented.     Labs reviewed: No results for input(s): "NA", "K", "CL", "CO2", "GLUCOSE", "BUN", "CREATININE", "CALCIUM", "MG", "PHOS" in the last 8760 hours. No results for input(s): "AST", "ALT", "ALKPHOS", "BILITOT", "PROT", "ALBUMIN" in the last 8760 hours. No results for input(s): "WBC", "NEUTROABS", "HGB", "HCT", "MCV", "PLT" in the last 8760 hours. Lab Results  Component Value Date   TSH 1.78 01/14/2014   No results found for: "HGBA1C" No results found for: "CHOL", "HDL", "LDLCALC", "LDLDIRECT", "TRIG", "CHOLHDL"  Significant Diagnostic Results in last 30 days:  No results found.  Assessment/Plan Severe late onset Alzheimer's dementia without behavioral disturbance, psychotic disturbance, mood disturbance, or anxiety (HCC)  Chronic idiopathic constipation  Closed dislocation of left hip, subsequent encounter Patient has continued slow progression of her dementia. Total dependence. Non-ambulatory and due to poor positioning, can no longer safely use a wheelchair. When OOB, uses geri-chair. Continue pain medication for dislocated hip and PRN constipation medications. At this time, if patient has decline, she will have comfort measures only.    Family/ staff Communication: nursing  Labs/tests ordered:  none

## 2023-09-18 ENCOUNTER — Encounter: Payer: Self-pay | Admitting: Student

## 2023-10-07 ENCOUNTER — Other Ambulatory Visit: Payer: Self-pay | Admitting: Student

## 2023-10-07 DIAGNOSIS — G8929 Other chronic pain: Secondary | ICD-10-CM

## 2023-10-07 NOTE — Telephone Encounter (Signed)
 [facility] Patient.  Pharmacy requested refill.  Pended Rx and sent to Dr. Jann Melody for approval.

## 2023-11-05 ENCOUNTER — Other Ambulatory Visit: Payer: Self-pay | Admitting: Student

## 2023-11-05 DIAGNOSIS — G8929 Other chronic pain: Secondary | ICD-10-CM

## 2023-11-05 MED ORDER — TRAMADOL HCL 50 MG PO TABS: 50.0000 mg | ORAL_TABLET | Freq: Three times a day (TID) | ORAL | 0 refills | Status: DC

## 2023-11-05 NOTE — Progress Notes (Signed)
Refill of chronic pain medications.

## 2023-11-20 ENCOUNTER — Non-Acute Institutional Stay (SKILLED_NURSING_FACILITY): Payer: Self-pay | Admitting: Nurse Practitioner

## 2023-11-20 ENCOUNTER — Encounter: Payer: Self-pay | Admitting: Nurse Practitioner

## 2023-11-20 DIAGNOSIS — F39 Unspecified mood [affective] disorder: Secondary | ICD-10-CM | POA: Diagnosis not present

## 2023-11-20 DIAGNOSIS — K5904 Chronic idiopathic constipation: Secondary | ICD-10-CM | POA: Diagnosis not present

## 2023-11-20 DIAGNOSIS — F02C0 Dementia in other diseases classified elsewhere, severe, without behavioral disturbance, psychotic disturbance, mood disturbance, and anxiety: Secondary | ICD-10-CM

## 2023-11-20 DIAGNOSIS — G301 Alzheimer's disease with late onset: Secondary | ICD-10-CM | POA: Diagnosis not present

## 2023-11-20 DIAGNOSIS — D509 Iron deficiency anemia, unspecified: Secondary | ICD-10-CM

## 2023-11-20 DIAGNOSIS — M25552 Pain in left hip: Secondary | ICD-10-CM

## 2023-11-20 DIAGNOSIS — G8929 Other chronic pain: Secondary | ICD-10-CM

## 2023-11-20 NOTE — Progress Notes (Signed)
 Location:  Other [place].  Nursing Home Room Number: [facility] [address] Place of Service:  SNF [phone]) Harlene Dancer, NP  PCP: Abdul Fine, MD  Patient Care Team: Abdul Fine, MD as PCP - General (Family Medicine)  Extended Emergency Contact Information Primary Emergency Contact: [facility] Phone: [phone] Relation: Daughter Preferred language: English Interpreter needed? No Secondary Emergency Contact: [facility] Mobile Phone: [phone] Relation: Son  Goals of care: Advanced Directive information    11/20/2023   11:36 AM  Advanced Directives  Does Patient Have a Medical Advance Directive? Yes  Type of Advance Directive Out of facility DNR (pink MOST or yellow form)  Does patient want to make changes to medical advance directive? No - Patient declined     Chief Complaint  Patient presents with   Medical Management of Chronic Issues    Medical Management of Chronic Issues.     HPI:  Pt is a 89 y.o. female seen today for medical management of chronic disease.  Pt on hospice due to advance dementia, chronic dislocation of left hip which leads to chronic pain She is bed bound due to chronic dislocation and pain.  Staff has no concerns. Pain is well controlled.  Continues to have hospice follow.  Weights have been stable, requires total care from staff    Past Medical History:  Diagnosis Date   Elevated cholesterol    Hypertension    Past Surgical History:  Procedure Laterality Date   arthroscopic shoulder Right 2001   BASAL CELL CARCINOMA EXCISION  2008   on nose   basal joint hand replace  2000   CATARACT EXTRACTION, BILATERAL  1999   child birth  83 and 1954   x 2   CYSTECTOMY  2009   lower back   HIP CLOSED REDUCTION Left 08/14/2022   Procedure: CLOSED REDUCTION HIP;  Surgeon: Tobie Priest, MD;  Location: [facility] ORS;  Service: Orthopedics;  Laterality: Left;   PILONIDAL CYST EXCISION  1951   TOTAL HIP ARTHROPLASTY Left  12/01   TOTAL HIP ARTHROPLASTY Right 2003    Allergies  Allergen Reactions   Etodolac Other (See Comments)    Increased BP   Tape     Outpatient Encounter Medications as of 11/20/2023  Medication Sig   acetaminophen  (TYLENOL ) 325 MG tablet Take 325 mg by mouth every 8 (eight) hours as needed.   acetaminophen  (TYLENOL ) 650 MG CR tablet Take 650 mg by mouth 3 (three) times daily.   escitalopram (LEXAPRO) 5 MG tablet Take 5 mg by mouth daily.   Morphine  Sulfate, Concentrate, 5 MG/0.25ML SOLN Take 0.25 mg by mouth every 4 (four) hours as needed for severe pain.   traMADol  (ULTRAM ) 50 MG tablet Take 1 tablet (50 mg total) by mouth 3 (three) times daily.   ZINC OXIDE EX Apply topically. Apply to sacrum, coccyx, buttocks topically as needed for protection   No facility-administered encounter medications on file as of 11/20/2023.    Review of Systems  Unable to perform ROS: Dementia     Immunization History  Administered Date(s) Administered   Influenza-Unspecified 02/29/2020, 02/26/2021, 02/26/2022, 03/05/2023   MODERNA COVID-19 SARS-COV-2 PEDS BIVALENT BOOSTER 59yr-62yr 03/22/2022   Moderna Covid-19 Vaccine Bivalent Booster 94yrs & up 10/09/2021, 03/22/2022   Moderna Sars-Covid-2 Vaccination 05/23/2019, 06/20/2019, 03/22/2020, 09/29/2020, 02/02/2021   PNEUMOCOCCAL CONJUGATE-20 05/14/2023   Pneumococcal Polysaccharide-23 10/12/2011   Pertinent  Health Maintenance Due  Topic Date Due   INFLUENZA VACCINE  12/12/2023      05/12/2021  7:20 PM 12/05/2021    5:21 PM  Fall Risk  (RETIRED) Patient Fall Risk Level High fall risk  High fall risk      Data saved with a previous flowsheet row definition   Functional Status Survey:    Vitals:   11/20/23 1132  BP: 115/64  Pulse: 84  Resp: 16  Temp: (!) 96.8 F (36 C)  SpO2: 97%  Weight: 112 lb 6.4 oz (51 kg)  Height: 5' 6 (1.676 m)   Body mass index is 18.14 kg/m. Wt Readings from Last 3 Encounters:  11/20/23 112 lb 6.4 oz  (51 kg)  09/17/23 110 lb (49.9 kg)  08/05/23 113 lb 3.2 oz (51.3 kg)    Physical Exam Constitutional:      General: She is not in acute distress.    Appearance: She is well-developed. She is not diaphoretic.  HENT:     Head: Normocephalic and atraumatic.     Mouth/Throat:     Pharynx: No oropharyngeal exudate.  Eyes:     Conjunctiva/sclera: Conjunctivae normal.     Pupils: Pupils are equal, round, and reactive to light.  Cardiovascular:     Rate and Rhythm: Normal rate and regular rhythm.     Heart sounds: Normal heart sounds.  Pulmonary:     Effort: Pulmonary effort is normal.     Breath sounds: Normal breath sounds.  Abdominal:     General: Bowel sounds are normal.     Palpations: Abdomen is soft.  Musculoskeletal:     Cervical back: Normal range of motion and neck supple.     Right lower leg: No edema.     Left lower leg: No edema.  Skin:    General: Skin is warm and dry.  Neurological:     Mental Status: She is alert. Mental status is at baseline.     Motor: Weakness present.     Gait: Gait abnormal.  Psychiatric:        Mood and Affect: Mood normal.     Labs reviewed: No results for input(s): NA, K, CL, CO2, GLUCOSE, BUN, CREATININE, CALCIUM, MG, PHOS in the last 8760 hours. No results for input(s): AST, ALT, ALKPHOS, BILITOT, PROT, ALBUMIN in the last 8760 hours. No results for input(s): WBC, NEUTROABS, HGB, HCT, MCV, PLT in the last 8760 hours. Lab Results  Component Value Date   TSH 1.78 01/14/2014   No results found for: HGBA1C No results found for: CHOL, HDL, LDLCALC, LDLDIRECT, TRIG, CHOLHDL  Significant Diagnostic Results in last 30 days:  No results found.  Assessment/Plan Chronic hip pain, left Due to chronic dislocation. Continues on tramadol  TID for pain management which is effective.   Chronic idiopathic constipation Stable at this time.   Mood disorder (HCC) Controlled on lexapro 5  mg daily   Severe late onset Alzheimer's dementia without behavioral disturbance, psychotic disturbance, mood disturbance, or anxiety (HCC) Stable, advanced, no acute changes in cognitive or functional status, continue supportive care.      Clancey Welton K. Caro BODILY [facility] [phone]

## 2023-11-21 DIAGNOSIS — K5904 Chronic idiopathic constipation: Secondary | ICD-10-CM

## 2023-11-21 DIAGNOSIS — F39 Unspecified mood [affective] disorder: Secondary | ICD-10-CM

## 2023-11-21 DIAGNOSIS — G8929 Other chronic pain: Secondary | ICD-10-CM | POA: Insufficient documentation

## 2023-11-21 HISTORY — DX: Chronic idiopathic constipation: K59.04

## 2023-11-21 HISTORY — DX: Unspecified mood (affective) disorder: F39

## 2023-11-21 NOTE — Assessment & Plan Note (Signed)
 Stable, advanced, no acute changes in cognitive or functional status, continue supportive care.

## 2023-11-21 NOTE — Assessment & Plan Note (Signed)
 Stable at this time

## 2023-11-21 NOTE — Assessment & Plan Note (Signed)
 Due to chronic dislocation. Continues on tramadol  TID for pain management which is effective.

## 2023-11-21 NOTE — Assessment & Plan Note (Signed)
 Controlled on lexapro 5 mg daily

## 2024-01-02 ENCOUNTER — Other Ambulatory Visit: Payer: Self-pay | Admitting: Student

## 2024-01-02 DIAGNOSIS — G8929 Other chronic pain: Secondary | ICD-10-CM

## 2024-01-06 ENCOUNTER — Other Ambulatory Visit: Payer: Self-pay | Admitting: Student

## 2024-01-06 ENCOUNTER — Other Ambulatory Visit: Payer: Self-pay | Admitting: Nurse Practitioner

## 2024-01-06 DIAGNOSIS — G8929 Other chronic pain: Secondary | ICD-10-CM

## 2024-01-06 MED ORDER — TRAMADOL HCL 50 MG PO TABS: 50.0000 mg | ORAL_TABLET | Freq: Three times a day (TID) | ORAL | 0 refills | Status: DC

## 2024-01-09 MED ORDER — TRAMADOL HCL 50 MG PO TABS: 50.0000 mg | ORAL_TABLET | Freq: Three times a day (TID) | ORAL | 0 refills | Status: DC

## 2024-02-03 ENCOUNTER — Encounter: Payer: Self-pay | Admitting: Nurse Practitioner

## 2024-02-03 ENCOUNTER — Non-Acute Institutional Stay (SKILLED_NURSING_FACILITY): Payer: Self-pay | Admitting: Nurse Practitioner

## 2024-02-03 DIAGNOSIS — K5904 Chronic idiopathic constipation: Secondary | ICD-10-CM | POA: Diagnosis not present

## 2024-02-03 DIAGNOSIS — G301 Alzheimer's disease with late onset: Secondary | ICD-10-CM

## 2024-02-03 DIAGNOSIS — M25552 Pain in left hip: Secondary | ICD-10-CM | POA: Diagnosis not present

## 2024-02-03 DIAGNOSIS — G8929 Other chronic pain: Secondary | ICD-10-CM

## 2024-02-03 DIAGNOSIS — F02C0 Dementia in other diseases classified elsewhere, severe, without behavioral disturbance, psychotic disturbance, mood disturbance, and anxiety: Secondary | ICD-10-CM

## 2024-02-03 DIAGNOSIS — F39 Unspecified mood [affective] disorder: Secondary | ICD-10-CM | POA: Diagnosis not present

## 2024-02-03 DIAGNOSIS — M1990 Unspecified osteoarthritis, unspecified site: Secondary | ICD-10-CM

## 2024-02-03 NOTE — Progress Notes (Unsigned)
 Location:  Other [place].  Nursing Home Room Number: [facility] [id number] Place of Service:  SNF [phone]) Harlene An, NP  PCP: Abdul Fine, MD  Patient Care Team: Abdul Fine, MD as PCP - General (Family Medicine)  Extended Emergency Contact Information Primary Emergency Contact: [facility] Phone: [phone] Relation: Daughter Preferred language: English Interpreter needed? No Secondary Emergency Contact: [facility] Mobile Phone: [phone] Relation: Son  Goals of care: Advanced Directive information    02/03/2024    3:18 PM  Advanced Directives  Does Patient Have a Medical Advance Directive? Yes  Type of Advance Directive Out of facility DNR (pink MOST or yellow form)  Does patient want to make changes to medical advance directive? No - Patient declined     Chief Complaint  Patient presents with   Medical Management of Chronic Issues    Medical Management of Chronic Issues.     HPI:  Pt is a 89 y.o. female seen today for medical management of chronic disease. Pt with hx of dementia, chronic hip dislocation, hypertension, hyperlipidemia.  She stays in bed most the time due to pain from the chronic hip dislocation and is on chronic pain medication which controls pain for the most part. She still yells out in pain occasionally.  She dependent for ADLs but able to feed herself.  She is plesently confused and denies any pain at the time of visit. Reports she is happy and smiles in bed.     Past Medical History:  Diagnosis Date   Elevated cholesterol    Hypertension    Past Surgical History:  Procedure Laterality Date   arthroscopic shoulder Right 2001   BASAL CELL CARCINOMA EXCISION  2008   on nose   basal joint hand replace  2000   CATARACT EXTRACTION, BILATERAL  1999   child birth  56 and 1954   x 2   CYSTECTOMY  2009   lower back   HIP CLOSED REDUCTION Left 08/14/2022   Procedure: CLOSED REDUCTION HIP;  Surgeon: Tobie Priest,  MD;  Location: [facility] ORS;  Service: Orthopedics;  Laterality: Left;   PILONIDAL CYST EXCISION  1951   TOTAL HIP ARTHROPLASTY Left 12/01   TOTAL HIP ARTHROPLASTY Right 2003    Allergies  Allergen Reactions   Etodolac Other (See Comments)    Increased BP   Tape     Outpatient Encounter Medications as of 02/03/2024  Medication Sig   acetaminophen  (TYLENOL ) 325 MG tablet Take 325 mg by mouth every 8 (eight) hours as needed.   acetaminophen  (TYLENOL ) 650 MG CR tablet Take 650 mg by mouth 3 (three) times daily.   escitalopram (LEXAPRO) 5 MG tablet Take 5 mg by mouth daily.   Morphine  Sulfate, Concentrate, 5 MG/0.25ML SOLN Take 0.25 mg by mouth every 4 (four) hours as needed for severe pain.   traMADol  (ULTRAM ) 50 MG tablet Take 1 tablet (50 mg total) by mouth 3 (three) times daily.   traMADol  (ULTRAM ) 50 MG tablet Take 1 tablet (50 mg total) by mouth 3 (three) times daily.   ZINC OXIDE EX Apply topically. Apply to sacrum, coccyx, buttocks topically as needed for protection   No facility-administered encounter medications on file as of 02/03/2024.    Review of Systems  Unable to perform ROS: Dementia     Immunization History  Administered Date(s) Administered   Influenza-Unspecified 02/29/2020, 02/26/2021, 02/26/2022, 03/05/2023   MODERNA COVID-19 SARS-COV-2 PEDS BIVALENT BOOSTER 31yr-44yr 03/22/2022   Moderna Covid-19 Vaccine Bivalent Booster 18yrs & up  10/09/2021, 03/22/2022   Moderna Sars-Covid-2 Vaccination 05/23/2019, 06/20/2019, 03/22/2020, 09/29/2020, 02/02/2021   PNEUMOCOCCAL CONJUGATE-20 05/14/2023   Pneumococcal Polysaccharide-23 10/12/2011   Pertinent  Health Maintenance Due  Topic Date Due   Influenza Vaccine  12/12/2023      05/12/2021    7:20 PM 12/05/2021    5:21 PM  Fall Risk  (RETIRED) Patient Fall Risk Level High fall risk  High fall risk      Data saved with a previous flowsheet row definition   Functional Status Survey:    Vitals:   02/03/24 1514  BP:  95/60  Pulse: 77  Resp: 16  Temp: (!) 96.1 F (35.6 C)  SpO2: 95%  Weight: 110 lb 8 oz (50.1 kg)  Height: 5' 6 (1.676 m)   Body mass index is 17.84 kg/m. Wt Readings from Last 3 Encounters:  02/03/24 110 lb 8 oz (50.1 kg)  11/20/23 112 lb 6.4 oz (51 kg)  09/17/23 110 lb (49.9 kg)    Physical Exam Constitutional:      General: She is not in acute distress.    Appearance: She is well-developed. She is not diaphoretic.  HENT:     Head: Normocephalic and atraumatic.     Mouth/Throat:     Pharynx: No oropharyngeal exudate.  Eyes:     Conjunctiva/sclera: Conjunctivae normal.     Pupils: Pupils are equal, round, and reactive to light.  Cardiovascular:     Rate and Rhythm: Normal rate and regular rhythm.     Heart sounds: Normal heart sounds.  Pulmonary:     Effort: Pulmonary effort is normal.     Breath sounds: Normal breath sounds.  Abdominal:     General: Bowel sounds are normal.     Palpations: Abdomen is soft.  Musculoskeletal:        General: No tenderness.     Cervical back: Normal range of motion and neck supple.     Comments: Contractor of left hip   Skin:    General: Skin is warm and dry.  Neurological:     Mental Status: She is alert. She is disoriented.     Motor: Weakness present.     Gait: Gait abnormal.     Labs reviewed: No results for input(s): NA, K, CL, CO2, GLUCOSE, BUN, CREATININE, CALCIUM, MG, PHOS in the last 8760 hours. No results for input(s): AST, ALT, ALKPHOS, BILITOT, PROT, ALBUMIN in the last 8760 hours. No results for input(s): WBC, NEUTROABS, HGB, HCT, MCV, PLT in the last 8760 hours. Lab Results  Component Value Date   TSH 1.78 01/14/2014   No results found for: HGBA1C No results found for: CHOL, HDL, LDLCALC, LDLDIRECT, TRIG, CHOLHDL  Significant Diagnostic Results in last 30 days:  No results found.  Assessment/Plan Chronic hip pain, left Due to chronic dislocation.  Continues on tramadol  TID  and tylenol  scheduled for pain management which is effective.   Chronic idiopathic constipation Stable at this time.  Not currently on medication  Mood disorder Controlled on lexapro 5 mg daily.  Continue current regimen  Severe late onset Alzheimer's dementia without behavioral disturbance, psychotic disturbance, mood disturbance, or anxiety (HCC) Stable, advanced, no acute changes in cognitive or functional status, continue supportive care.   Arthritis Stable on tylenol  650 mg TID scheduled.      Jessica K. Caro BODILY [facility] [phone]

## 2024-02-04 NOTE — Assessment & Plan Note (Signed)
 Stable at this time.  Not currently on medication

## 2024-02-04 NOTE — Assessment & Plan Note (Signed)
 Due to chronic dislocation. Continues on tramadol  TID  and tylenol  scheduled for pain management which is effective.

## 2024-02-04 NOTE — Assessment & Plan Note (Signed)
 Stable on tylenol  650 mg TID scheduled.

## 2024-02-04 NOTE — Assessment & Plan Note (Signed)
 Controlled on lexapro 5 mg daily.  Continue current regimen

## 2024-02-04 NOTE — Assessment & Plan Note (Signed)
 Stable, advanced, no acute changes in cognitive or functional status, continue supportive care.

## 2024-02-10 ENCOUNTER — Other Ambulatory Visit: Payer: Self-pay | Admitting: *Deleted

## 2024-02-10 DIAGNOSIS — G8929 Other chronic pain: Secondary | ICD-10-CM

## 2024-02-10 MED ORDER — TRAMADOL HCL 50 MG PO TABS: 50.0000 mg | ORAL_TABLET | Freq: Three times a day (TID) | ORAL | 0 refills | Status: DC

## 2024-02-10 NOTE — Telephone Encounter (Signed)
 Jessica received refill Requst from [facility] Rx and sent to [facility] for approval.

## 2024-03-09 ENCOUNTER — Other Ambulatory Visit: Payer: Self-pay | Admitting: Nurse Practitioner

## 2024-03-09 DIAGNOSIS — G8929 Other chronic pain: Secondary | ICD-10-CM

## 2024-03-09 MED ORDER — TRAMADOL HCL 50 MG PO TABS: 50.0000 mg | ORAL_TABLET | Freq: Three times a day (TID) | ORAL | 0 refills | Status: DC

## 2024-03-17 ENCOUNTER — Encounter: Payer: Self-pay | Admitting: Orthopedic Surgery

## 2024-03-17 ENCOUNTER — Non-Acute Institutional Stay (SKILLED_NURSING_FACILITY): Payer: Self-pay | Admitting: Orthopedic Surgery

## 2024-03-17 DIAGNOSIS — G8929 Other chronic pain: Secondary | ICD-10-CM

## 2024-03-17 DIAGNOSIS — F39 Unspecified mood [affective] disorder: Secondary | ICD-10-CM | POA: Diagnosis not present

## 2024-03-17 DIAGNOSIS — M25552 Pain in left hip: Secondary | ICD-10-CM

## 2024-03-17 DIAGNOSIS — Z5181 Encounter for therapeutic drug level monitoring: Secondary | ICD-10-CM | POA: Diagnosis not present

## 2024-03-17 DIAGNOSIS — G301 Alzheimer's disease with late onset: Secondary | ICD-10-CM | POA: Diagnosis not present

## 2024-03-17 DIAGNOSIS — F02B0 Dementia in other diseases classified elsewhere, moderate, without behavioral disturbance, psychotic disturbance, mood disturbance, and anxiety: Secondary | ICD-10-CM

## 2024-03-17 NOTE — Progress Notes (Signed)
 Location:  Other [place].  Nursing Home Room Number: [facility] [id number] Place of Service:  SNF [phone]) Provider:  Greig Cluster, NP  PCP: [name], DO  Patient Care Team: [name], DO as PCP - General (Internal Medicine)  Extended Emergency Contact Information Primary Emergency Contact: [facility] Phone: [phone] Relation: Daughter Preferred language: English Interpreter needed? No Secondary Emergency Contact: [facility] Mobile Phone: [phone] Relation: Son  Code Status:  DNR Goals of care: Advanced Directive information    02/03/2024    3:18 PM  Advanced Directives  Does Patient Have a Medical Advance Directive? Yes  Type of Advance Directive Out of facility DNR (pink MOST or yellow form)  Does patient want to make changes to medical advance directive? No - Patient declined     Chief Complaint  Patient presents with   Medical Management of Chronic Issues    Medical Management of Chronic Issues.     HPI:  Pt is a 89 y.o. female seen today for medical management of chronic diseases.    She currently resides on the skilled nursing unit at [facility]. PMH: AD, arthritis, HLD, spinal stenosis, chronic left hip pain with debility ( h/o left hip replacement 2001), right hip replacement 2003, basal cell carcinoma 2008 and mood disorder.   Mood disorder- h/o anxiety/depression with suicidal ideation in past, no changes in mood, Trazodone  discontinued 05/2023, Na+ 137 08/2023, remains on low dose Lexapro Dementia- recent BIMS 3/15 01/30/2024, CT head noted moderate severity cerebral atrophy and microvascular disease 04/2021, no behaviors, bed bound due to left hip, dependent with ADLs except feeding, previously followed by hospice> d/c due to weight gain Chronic left hip pain- due to chronic dislocation, remains on tylenol /tramadol  and morphine  prn  Recent weights:  11/05- 110 lbs  10/01- 110.9 lbs  09/04- 110.5 lbs    Past Medical History:   Diagnosis Date   Elevated cholesterol    Hypertension    Past Surgical History:  Procedure Laterality Date   arthroscopic shoulder Right 2001   BASAL CELL CARCINOMA EXCISION  2008   on nose   basal joint hand replace  2000   CATARACT EXTRACTION, BILATERAL  1999   child birth  42 and 1954   x 2   CYSTECTOMY  2009   lower back   HIP CLOSED REDUCTION Left 08/14/2022   Procedure: CLOSED REDUCTION HIP;  Surgeon: Tobie Priest, MD;  Location: [facility] ORS;  Service: Orthopedics;  Laterality: Left;   PILONIDAL CYST EXCISION  1951   TOTAL HIP ARTHROPLASTY Left 12/01   TOTAL HIP ARTHROPLASTY Right 2003    Allergies  Allergen Reactions   Etodolac Other (See Comments)    Increased BP   Tape     Outpatient Encounter Medications as of 03/17/2024  Medication Sig   acetaminophen  (TYLENOL ) 325 MG tablet Take 325 mg by mouth every 8 (eight) hours as needed.   acetaminophen  (TYLENOL ) 650 MG CR tablet Take 650 mg by mouth 3 (three) times daily.   escitalopram (LEXAPRO) 5 MG tablet Take 5 mg by mouth daily.   Morphine  Sulfate, Concentrate, 5 MG/0.25ML SOLN Take 0.25 mg by mouth every 4 (four) hours as needed for severe pain.   traMADol  (ULTRAM ) 50 MG tablet Take 1 tablet (50 mg total) by mouth 3 (three) times daily.   ZINC OXIDE EX Apply topically. Apply to sacrum, coccyx, buttocks topically as needed for protection   No facility-administered encounter medications on file as of 03/17/2024.    Review of  Systems  Unable to perform ROS: Dementia    Immunization History  Administered Date(s) Administered   Influenza-Unspecified 02/29/2020, 02/26/2021, 02/26/2022, 03/05/2023, 02/24/2024   MODERNA COVID-19 SARS-COV-2 PEDS BIVALENT BOOSTER 27yr-35yr 03/22/2022   Moderna Covid-19 Vaccine Bivalent Booster 65yrs & up 10/09/2021, 03/22/2022   Moderna Sars-Covid-2 Vaccination 05/23/2019, 06/20/2019, 03/22/2020, 09/29/2020, 02/02/2021   PNEUMOCOCCAL CONJUGATE-20 05/14/2023   Pneumococcal  Polysaccharide-23 10/12/2011   Pertinent  Health Maintenance Due  Topic Date Due   Influenza Vaccine  Completed      05/12/2021    7:20 PM 12/05/2021    5:21 PM  Fall Risk  (RETIRED) Patient Fall Risk Level High fall risk  High fall risk      Data saved with a previous flowsheet row definition   Functional Status Survey:    Vitals:   03/17/24 1033  BP: (!) 99/58  Pulse: 76  Resp: 14  Temp: (!) 97.4 F (36.3 C)  SpO2: 94%  Weight: 110 lb (49.9 kg)  Height: 5' 6 (1.676 m)   Body mass index is 17.75 kg/m. Physical Exam Vitals reviewed.  Constitutional:      General: She is not in acute distress. HENT:     Head: Normocephalic.  Eyes:     General:        Right eye: No discharge.        Left eye: No discharge.  Cardiovascular:     Rate and Rhythm: Normal rate and regular rhythm.     Pulses: Normal pulses.     Heart sounds: Normal heart sounds.  Pulmonary:     Effort: Pulmonary effort is normal.     Breath sounds: Normal breath sounds.  Abdominal:     General: Bowel sounds are normal. There is no distension.     Palpations: Abdomen is soft.     Tenderness: There is no abdominal tenderness.  Musculoskeletal:     Cervical back: Neck supple.     Right lower leg: No edema.     Left lower leg: No edema.     Comments: Left hip/knee contracted  Skin:    General: Skin is warm.     Capillary Refill: Capillary refill takes less than 2 seconds.  Neurological:     General: No focal deficit present.     Mental Status: She is alert. Mental status is at baseline.     Motor: Weakness present.     Gait: Gait abnormal.  Psychiatric:        Mood and Affect: Mood normal.     Comments: Very pleasant, alert to self/familiar face, followed commands     Labs reviewed: No results for input(s): NA, K, CL, CO2, GLUCOSE, BUN, CREATININE, CALCIUM, MG, PHOS in the last 8760 hours. No results for input(s): AST, ALT, ALKPHOS, BILITOT, PROT, ALBUMIN in  the last 8760 hours. No results for input(s): WBC, NEUTROABS, HGB, HCT, MCV, PLT in the last 8760 hours. Lab Results  Component Value Date   TSH 1.78 01/14/2014   No results found for: HGBA1C No results found for: CHOL, HDL, LDLCALC, LDLDIRECT, TRIG, CHOLHDL  Significant Diagnostic Results in last 30 days:  No results found.  Assessment/Plan 1. Encounter for medication titration (Primary) - stable mood - increased risk serotonin syndrome with tramadol  and Lexapro - will wean off Lexapro  - if GDR unsuccessful consider SNRI (cymbalta) for chronic left hip pain and mood   2. Mood disorder - see above - h/o anxiety/depression and suicidal ideation with psych admission 2015 - 04/2023  Trazodone  discontinued   3. Moderate late onset Alzheimer's dementia without behavioral disturbance, psychotic disturbance, mood disturbance, or anxiety (HCC) - no behaviors - dependent with ADLs except feeding - 04/2023 hospice discontinued due to wight gain - weight stable - bed bound - cont skilled nursing  4. Chronic hip pain, left - due to chronic dislocation - left hip/knee contracted - bed bound  - cont tylenol , tramadol  and morphine      Family/ staff Communication: plan discussed with nurse  Labs/tests ordered:  none

## 2024-04-06 ENCOUNTER — Other Ambulatory Visit: Payer: Self-pay | Admitting: *Deleted

## 2024-04-06 DIAGNOSIS — G8929 Other chronic pain: Secondary | ICD-10-CM

## 2024-04-06 MED ORDER — TRAMADOL HCL 50 MG PO TABS: 50.0000 mg | ORAL_TABLET | Freq: Three times a day (TID) | ORAL | 0 refills | Status: DC

## 2024-04-06 NOTE — Telephone Encounter (Signed)
 [facility] requesting refill. Request sheet given to Jessica by TL Nurse.  Pended Rx and sent to [facility] for approval

## 2024-04-13 ENCOUNTER — Encounter: Payer: Self-pay | Admitting: Nurse Practitioner

## 2024-04-13 ENCOUNTER — Non-Acute Institutional Stay (SKILLED_NURSING_FACILITY): Payer: Self-pay | Admitting: Nurse Practitioner

## 2024-04-13 DIAGNOSIS — G301 Alzheimer's disease with late onset: Secondary | ICD-10-CM

## 2024-04-13 DIAGNOSIS — K5904 Chronic idiopathic constipation: Secondary | ICD-10-CM | POA: Diagnosis not present

## 2024-04-13 DIAGNOSIS — F39 Unspecified mood [affective] disorder: Secondary | ICD-10-CM

## 2024-04-13 DIAGNOSIS — M25552 Pain in left hip: Secondary | ICD-10-CM | POA: Diagnosis not present

## 2024-04-13 DIAGNOSIS — F02C0 Dementia in other diseases classified elsewhere, severe, without behavioral disturbance, psychotic disturbance, mood disturbance, and anxiety: Secondary | ICD-10-CM

## 2024-04-13 DIAGNOSIS — G8929 Other chronic pain: Secondary | ICD-10-CM

## 2024-04-13 MED ORDER — TRAMADOL HCL 50 MG PO TABS: 50.0000 mg | ORAL_TABLET | Freq: Three times a day (TID) | ORAL | 0 refills | Status: DC

## 2024-04-13 NOTE — Assessment & Plan Note (Signed)
 Stable at this time.  Not currently on medication

## 2024-04-13 NOTE — Assessment & Plan Note (Signed)
 Due to chronic dislocation. Continues on tramadol  TID  and tylenol  scheduled for pain management which is effective. Will dc morphine  due to nonuse

## 2024-04-13 NOTE — Assessment & Plan Note (Signed)
 Lexapro has been stopped and she is doing well off medication No increase in anxiety or depression noted.  Will continue to monitor.

## 2024-04-13 NOTE — Progress Notes (Signed)
 Location:  Other [place].  Nursing Home Room Number: [facility] [id number] Place of Service:  SNF [phone]) Harlene An, NP  PCP: [name], DO  Patient Care Team: [name], DO as PCP - General (Internal Medicine)  Extended Emergency Contact Information Primary Emergency Contact: [facility] Phone: [phone] Relation: Daughter Preferred language: English Interpreter needed? No Secondary Emergency Contact: [facility] Mobile Phone: [phone] Relation: Son  Goals of care: Advanced Directive information    04/13/2024    1:16 PM  Advanced Directives  Does Patient Have a Medical Advance Directive? Yes  Type of Advance Directive Out of facility DNR (pink MOST or yellow form)  Does patient want to make changes to medical advance directive? No - Patient declined     Chief Complaint  Patient presents with   Medical Management of Chronic Issues    Medical Management of Chronic Issues.     HPI:  Pt is a 89 y.o. female seen today for medical management of chronic disease. Pt with dementia, chronic left hip pain due to chronic dislocation and therefore bed bound.  She has hx of mood disorder and was on lexapro. This was stopped last month and she has been doing well.  Her pain has been well controlled on tramadol  and tylenol .  She has advanced dementia but still feeds herself.  Weight has remained stable. Staff noted a ruptured cyst to her back last week but applied warm compresses and this has since healed.   Past Medical History:  Diagnosis Date   Elevated cholesterol    Hypertension    Past Surgical History:  Procedure Laterality Date   arthroscopic shoulder Right 2001   BASAL CELL CARCINOMA EXCISION  2008   on nose   basal joint hand replace  2000   CATARACT EXTRACTION, BILATERAL  1999   child birth  48 and 1954   x 2   CYSTECTOMY  2009   lower back   HIP CLOSED REDUCTION Left 08/14/2022   Procedure: CLOSED REDUCTION HIP;  Surgeon: Tobie Priest, MD;  Location: [facility] ORS;  Service: Orthopedics;  Laterality: Left;   PILONIDAL CYST EXCISION  1951   TOTAL HIP ARTHROPLASTY Left 12/01   TOTAL HIP ARTHROPLASTY Right 2003    Allergies  Allergen Reactions   Etodolac Other (See Comments)    Increased BP   Tape     Outpatient Encounter Medications as of 04/13/2024  Medication Sig   acetaminophen  (TYLENOL ) 325 MG tablet Take 325 mg by mouth every 8 (eight) hours as needed.   acetaminophen  (TYLENOL ) 650 MG CR tablet Take 650 mg by mouth 3 (three) times daily.   Morphine  Sulfate, Concentrate, 5 MG/0.25ML SOLN Take 0.25 mg by mouth every 4 (four) hours as needed for severe pain.   traMADol  (ULTRAM ) 50 MG tablet Take 1 tablet (50 mg total) by mouth 3 (three) times daily.   ZINC OXIDE EX Apply topically. Apply to sacrum, coccyx, buttocks topically as needed for protection   escitalopram (LEXAPRO) 5 MG tablet Take 5 mg by mouth every other day. (Patient not taking: Reported on 04/13/2024)   No facility-administered encounter medications on file as of 04/13/2024.    Review of Systems  Unable to perform ROS: Dementia     Immunization History  Administered Date(s) Administered   Influenza-Unspecified 02/29/2020, 02/26/2021, 02/26/2022, 03/05/2023, 02/24/2024   MODERNA COVID-19 SARS-COV-2 PEDS BIVALENT BOOSTER 37yr-54yr 03/22/2022   Moderna Covid-19 Vaccine Bivalent Booster 65yrs & up 10/09/2021, 03/22/2022   Moderna Sars-Covid-2 Vaccination 05/23/2019, 06/20/2019,  03/22/2020, 09/29/2020, 02/02/2021   PNEUMOCOCCAL CONJUGATE-20 05/14/2023   Pneumococcal Polysaccharide-23 10/12/2011   Pertinent  Health Maintenance Due  Topic Date Due   Influenza Vaccine  Completed      05/12/2021    7:20 PM 12/05/2021    5:21 PM 03/17/2024    1:05 PM  Fall Risk  Falls in the past year?   0  Was there an injury with Fall?   0   Fall Risk Category Calculator   0  (RETIRED) Patient Fall Risk Level High fall risk  High fall risk    Patient at Risk  for Falls Due to   History of fall(s);Impaired balance/gait;Impaired mobility  Fall risk Follow up   Falls evaluation completed     Data saved with a previous flowsheet row definition   Functional Status Survey:    Vitals:   04/13/24 1310  BP: 121/74  Pulse: 69  Resp: 16  Temp: 97.8 F (36.6 C)  SpO2: 96%  Weight: 110 lb (49.9 kg)  Height: 5' 6 (1.676 m)   Body mass index is 17.75 kg/m. Physical Exam Constitutional:      General: She is not in acute distress.    Appearance: She is well-developed. She is not diaphoretic.  HENT:     Head: Normocephalic and atraumatic.     Mouth/Throat:     Pharynx: No oropharyngeal exudate.  Eyes:     Conjunctiva/sclera: Conjunctivae normal.     Pupils: Pupils are equal, round, and reactive to light.  Cardiovascular:     Rate and Rhythm: Normal rate and regular rhythm.     Heart sounds: Normal heart sounds.  Pulmonary:     Effort: Pulmonary effort is normal.     Breath sounds: Normal breath sounds.  Abdominal:     General: Bowel sounds are normal.     Palpations: Abdomen is soft.  Musculoskeletal:        General: Deformity (hip contracture) present. No tenderness.     Cervical back: Normal range of motion and neck supple.  Skin:    General: Skin is warm and dry.  Neurological:     Mental Status: She is alert. Mental status is at baseline. She is disoriented.     Motor: Weakness present.     Gait: Gait abnormal.     Labs reviewed: No results for input(s): NA, K, CL, CO2, GLUCOSE, BUN, CREATININE, CALCIUM, MG, PHOS in the last 8760 hours. No results for input(s): AST, ALT, ALKPHOS, BILITOT, PROT, ALBUMIN in the last 8760 hours. No results for input(s): WBC, NEUTROABS, HGB, HCT, MCV, PLT in the last 8760 hours. Lab Results  Component Value Date   TSH 1.78 01/14/2014   No results found for: HGBA1C No results found for: CHOL, HDL, LDLCALC, LDLDIRECT, TRIG,  CHOLHDL  Significant Diagnostic Results in last 30 days:  No results found.  Assessment/Plan Severe late onset Alzheimer's dementia without behavioral disturbance, psychotic disturbance, mood disturbance, or anxiety (HCC) Stable, advanced, no acute changes in cognitive or functional status, continue supportive care.   Mood disorder Lexapro has been stopped and she is doing well off medication No increase in anxiety or depression noted.  Will continue to monitor.   Chronic idiopathic constipation Stable at this time.  Not currently on medication  Chronic hip pain, left Due to chronic dislocation. Continues on tramadol  TID  and tylenol  scheduled for pain management which is effective. Will dc morphine  due to nonuse     Nehal Shives K. Caro BODILY [facility]  Care & Adult Medicine [phone]

## 2024-04-13 NOTE — Assessment & Plan Note (Signed)
 Stable, advanced, no acute changes in cognitive or functional status, continue supportive care.

## 2024-05-14 ENCOUNTER — Encounter: Payer: Self-pay | Admitting: Orthopedic Surgery

## 2024-05-14 ENCOUNTER — Non-Acute Institutional Stay: Payer: Self-pay | Admitting: Orthopedic Surgery

## 2024-05-14 DIAGNOSIS — Z0000 Encounter for general adult medical examination without abnormal findings: Secondary | ICD-10-CM | POA: Diagnosis not present

## 2024-05-14 NOTE — Progress Notes (Signed)
 "  Chief Complaint  Patient presents with   Medicare Wellness    AWV     Subjective:   Kristin Woods is a 89 y.o. female who presents for a Medicare Annual Wellness Visit.  Visit info / Clinical Intake: Medicare Wellness Visit Type:: Subsequent Annual Wellness Visit Persons participating in visit and providing information:: patient Medicare Wellness Visit Mode:: In-person (required for WTM) Interpreter Needed?: No Pre-visit prep was completed: no AWV questionnaire completed by patient prior to visit?: no Living arrangements:: in nursing facility; in retirement community Patient's Overall Health Status Rating: good Typical amount of pain: some Does pain affect daily life?: (!) yes Are you currently prescribed opioids?: (!) yes  Dietary Habits and Nutritional Risks How many meals a day?: 2 Eats fruit and vegetables daily?: yes Most meals are obtained by: having others provide food In the last 2 weeks, have you had any of the following?: none Diabetic:: no  Functional Status Activities of Daily Living (to include ambulation/medication): (!) Dependent Feeding: Independent Dressing/Grooming: Dependent Bathing: Dependent Toileting: Dependent Transfer: Dependent Ambulation: Dependent Medication Administration: Dependent Is this a change from baseline?: Pre-admission baseline Home Management (perform basic housework or laundry): Dependent Manage your own finances?: (!) no Primary transportation is: facility / other Concerns about vision?: no *vision screening is required for WTM* Concerns about hearing?: no  Fall Screening Falls in the past year?: Exclusion - non ambulatory Number of falls in past year: 0 Was there an injury with Fall?: 0 Fall Risk Category Calculator: 0 Patient Fall Risk Level: Low Fall Risk  Fall Risk Patient at Risk for Falls Due to: History of fall(s); Impaired balance/gait; Impaired mobility Fall risk Follow up: Falls evaluation completed  Home  and Transportation Safety: All rugs have non-skid backing?: N/A, no rugs All stairs or steps have railings?: N/A, no stairs Grab bars in the bathtub or shower?: yes Have non-skid surface in bathtub or shower?: yes Good home lighting?: yes Regular seat belt use?: yes Hospital stays in the last year:: no  Cognitive Assessment Difficulty concentrating, remembering, or making decisions? : yes Will 6CIT or Mini Cog be Completed: no 6CIT or Mini Cog Declined: patient has a diagnosis of dementia or cognitive impairment  Advance Directives (For Healthcare) Does Patient Have a Medical Advance Directive?: Yes Does patient want to make changes to medical advance directive?: No - Patient declined Type of Advance Directive: Out of facility DNR (pink MOST or yellow form) Out of facility DNR (pink MOST or yellow form) in Chart? (Ambulatory ONLY): Yes - validated most recent copy scanned in chart  Reviewed/Updated  Reviewed/Updated: Reviewed All (Medical, Surgical, Family, Medications, Allergies, Care Teams, Patient Goals)    Allergies (verified) Etodolac and Tape   Current Medications (verified) Outpatient Encounter Medications as of 05/14/2024  Medication Sig   acetaminophen  (TYLENOL ) 325 MG tablet Take 325 mg by mouth every 8 (eight) hours as needed.   acetaminophen  (TYLENOL ) 650 MG CR tablet Take 650 mg by mouth 3 (three) times daily.   traMADol  (ULTRAM ) 50 MG tablet Take 1 tablet (50 mg total) by mouth 3 (three) times daily.   ZINC OXIDE EX Apply topically. Apply to sacrum, coccyx, buttocks topically as needed for protection   No facility-administered encounter medications on file as of 05/14/2024.    History: Past Medical History:  Diagnosis Date   Elevated cholesterol    Hypertension    Past Surgical History:  Procedure Laterality Date   arthroscopic shoulder Right 2001   BASAL CELL CARCINOMA  EXCISION  2008   on nose   basal joint hand replace  2000   CATARACT EXTRACTION,  BILATERAL  1999   child birth  18 and 1954   x 2   CYSTECTOMY  2009   lower back   HIP CLOSED REDUCTION Left 08/14/2022   Procedure: CLOSED REDUCTION HIP;  Surgeon: Tobie Priest, MD;  Location: [facility] ORS;  Service: Orthopedics;  Laterality: Left;   PILONIDAL CYST EXCISION  1951   TOTAL HIP ARTHROPLASTY Left 12/01   TOTAL HIP ARTHROPLASTY Right 2003   History reviewed. No pertinent family history. Social History   Occupational History   Not on file  Tobacco Use   Smoking status: Former   Smokeless tobacco: Never   Tobacco comments:    quit 1956  Vaping Use   Vaping status: Never Used  Substance and Sexual Activity   Alcohol use: Not Currently    Comment: social    Drug use: No   Sexual activity: Not on file   Tobacco Counseling Counseling given: Not Answered Tobacco comments: quit 1956  SDOH Screenings   Tobacco Use: Medium Risk (05/14/2024)   See flowsheets for full screening details  Depression Screen Depression Screening Exception Documentation Depression Screening Exception:: Medical reason (dementia)     Goals Addressed             This Visit's Progress    DIET - INCREASE WATER INTAKE   On track            Objective:    Today's Vitals   05/14/24 1212  BP: 139/83  Pulse: 65  Resp: 17  Temp: (!) 97.4 F (36.3 C)  SpO2: 98%  Weight: 99 lb (44.9 kg)  Height: 5' 6 (1.676 m)   Body mass index is 15.98 kg/m.  Hearing/Vision screen No results found. Immunizations and Health Maintenance Health Maintenance  Topic Date Due   Zoster Vaccines- Shingrix (1 of 2) Never done   COVID-19 Vaccine (8 - 2025-26 season) 01/12/2024   DTaP/Tdap/Td (1 - Tdap) 08/04/2024 (Originally 11/01/1942)   Pneumococcal Vaccine: 50+ Years  Completed   Influenza Vaccine  Completed   Meningococcal B Vaccine  Aged Out        Assessment/Plan:  This is a routine wellness examination for Rog[place].  Patient Care Team: [name], DO as PCP - General (Internal  Medicine)  I have personally reviewed and noted the following in the patients chart:   Medical and social history Use of alcohol, tobacco or illicit drugs  Current medications and supplements including opioid prescriptions. Functional ability and status Nutritional status Physical activity Advanced directives List of other physicians Hospitalizations, surgeries, and ER visits in previous 12 months Vitals Screenings to include cognitive, depression, and falls Referrals and appointments  No orders of the defined types were placed in this encounter.  In addition, I have reviewed and discussed with patient certain preventive protocols, quality metrics, and best practice recommendations. A written personalized care plan for preventive services as well as general preventive health recommendations were provided to patient.   Greig FORBES Cluster, NP   05/14/2024   Return in 1 year (on 05/14/2025).  After Visit Summary: (In Person-Declined) Patient declined AVS at this time.  Nurse Notes: Patient lives in skilled nursing. Unable to answer most of encounter, main nurse helping answer today. Do not recommend covid/shingles vaccines due to advanced age. Recent weight loss per nursing. Nursing family considering hospice if weight loss continues.  "

## 2024-05-21 ENCOUNTER — Encounter: Payer: Self-pay | Admitting: Internal Medicine

## 2024-05-21 ENCOUNTER — Non-Acute Institutional Stay (SKILLED_NURSING_FACILITY): Payer: Self-pay | Admitting: Internal Medicine

## 2024-05-21 DIAGNOSIS — F02C0 Dementia in other diseases classified elsewhere, severe, without behavioral disturbance, psychotic disturbance, mood disturbance, and anxiety: Secondary | ICD-10-CM | POA: Diagnosis not present

## 2024-05-21 DIAGNOSIS — G8929 Other chronic pain: Secondary | ICD-10-CM

## 2024-05-21 DIAGNOSIS — G301 Alzheimer's disease with late onset: Secondary | ICD-10-CM

## 2024-05-21 DIAGNOSIS — M1990 Unspecified osteoarthritis, unspecified site: Secondary | ICD-10-CM | POA: Diagnosis not present

## 2024-05-21 DIAGNOSIS — M25552 Pain in left hip: Secondary | ICD-10-CM

## 2024-05-21 DIAGNOSIS — Z7401 Bed confinement status: Secondary | ICD-10-CM

## 2024-05-21 NOTE — Progress Notes (Unsigned)
 [facility] SN[facility] Routine Visit Progress Note    Location:  Other [place].  Nursing Home Room Number: [facility] [id number] Place of Service:  SNF (31)   Kristin Locus, DO   Patient Care Team: Kristin Locus, DO as PCP - General (Internal Medicine)   Extended Emergency Contact Information Primary Emergency Contact: [facility] Phone: [phone] Relation: Daughter Preferred language: English Interpreter needed? No Secondary Emergency Contact: [facility] Mobile Phone: [phone] Relation: Son   Goals of care: Advanced Directive information    05/14/2024    1:31 PM  Advanced Directives  Does Patient Have a Medical Advance Directive? Yes  Type of Advance Directive Out of facility DNR (pink MOST or yellow form)  Does patient want to make changes to medical advance directive? No - Patient declined    CODE STATUS: Do Not Resuscitate (DNR)   Chief Complaint  Patient presents with   Medical Management of Chronic Issues    Medical Management of Chronic Issues.      HPI: Pt is a 89 y.o. female seen today for medical management of chronic disease.   Kristin Woods is a 89 year old female who was seen for routine medical care at [facility] skilled nursing facility for routine medical care of her chronic medical problems. She has a past medical history of hyperlipidemia, severe late onset Alzheimer type dementia with mood disorder but no behavioral disturbance.  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  She has chronic pain and is on Ultram  3 times a day.  Patient has dementia.  She mumbles some words but does not have any understandable sentences.   Past Medical History:  Diagnosis Date   Chronic idiopathic constipation 11/21/2023   Elevated cholesterol    Hospice care patient 01/27/2023   Hyperlipidemia 06/30/2022   Hypertension    Mood disorder 11/21/2023   Past Surgical History:  Procedure Laterality Date   arthroscopic shoulder Right 2001    BASAL CELL CARCINOMA EXCISION  2008   on nose   basal joint hand replace  2000   CATARACT EXTRACTION, BILATERAL  1999   child birth  48 and 1954   x 2   CYSTECTOMY  2009   lower back   HIP CLOSED REDUCTION Left 08/14/2022   Procedure: CLOSED REDUCTION HIP;  Surgeon: Tobie Priest, MD;  Location: [facility] ORS;  Service: Orthopedics;  Laterality: Left;   PILONIDAL CYST EXCISION  1951   TOTAL HIP ARTHROPLASTY Left 12/01   TOTAL HIP ARTHROPLASTY Right 2003     Allergies[1]   Outpatient Encounter Medications as of 05/21/2024  Medication Sig   acetaminophen  (TYLENOL ) 325 MG tablet Take 325 mg by mouth every 8 (eight) hours as needed.   acetaminophen  (TYLENOL ) 650 MG CR tablet Take 650 mg by mouth 3 (three) times daily.   traMADol  (ULTRAM ) 50 MG tablet Take 1 tablet (50 mg total) by mouth 3 (three) times daily.   ZINC OXIDE EX Apply topically. Apply to sacrum, coccyx, buttocks topically as needed for protection   No facility-administered encounter medications on file as of 05/21/2024.     Review of Systems  Unable to perform ROS: Dementia      Immunization History  Administered Date(s) Administered   Influenza-Unspecified 02/29/2020, 02/26/2021, 02/26/2022, 03/05/2023, 02/24/2024   MODERNA COVID-19 SARS-COV-2 PEDS BIVALENT BOOSTER 60yr-71yr 03/22/2022   Moderna Covid-19 Vaccine Bivalent Booster 71yrs & up 10/09/2021, 03/22/2022   Moderna Sars-Covid-2 Vaccination 05/23/2019, 06/20/2019, 03/22/2020, 09/29/2020, 02/02/2021   PNEUMOCOCCAL CONJUGATE-20 05/14/2023  Pneumococcal Polysaccharide-23 10/12/2011   Pertinent  Health Maintenance Due  Topic Date Due   Influenza Vaccine  Completed      05/12/2021    7:20 PM 12/05/2021    5:21 PM 03/17/2024    1:05 PM 05/14/2024    1:31 PM  Fall Risk  Falls in the past year?   0 Exclusion - non ambulatory  Was there an injury with Fall?   0    Fall Risk Category Calculator   0   (RETIRED) Patient Fall Risk Level High fall risk  High fall risk      Patient at Risk for Falls Due to   History of fall(s);Impaired balance/gait;Impaired mobility History of fall(s);Impaired balance/gait;Impaired mobility  Fall risk Follow up   Falls evaluation completed Falls evaluation completed     Data saved with a previous flowsheet row definition   Functional Status Survey:     Vitals:   05/21/24 1524  BP: (!) 105/59  Pulse: 80  Resp: 16  Temp: (!) 97.4 F (36.3 C)  SpO2: 96%  Weight: 100 lb 3.2 oz (45.5 kg)  Height: 5' 6 (1.676 m)   Body mass index is 16.17 kg/m. Physical Exam Vitals and nursing note reviewed.  Constitutional:      Comments: Arousable but not alert.  She appears the stated age of 89 years old.  HENT:     Head: Normocephalic and atraumatic.     Nose: Nose normal.  Eyes:     General: No scleral icterus. Cardiovascular:     Rate and Rhythm: Normal rate and regular rhythm.  Pulmonary:     Effort: Pulmonary effort is normal. No respiratory distress.  Abdominal:     General: Abdomen is flat. Bowel sounds are normal.  Musculoskeletal:     Comments: Left hip held in a state of flexion along with her left knee.  She had abduction of her left leg at the hip such that it was very difficult to uncross her thighs.  There is a pillow wedged between her knees.  When attempting to uncross her legs, she tried to hit me with her left hand.      Labs reviewed:  Lab Results  Component Value Date   TSH 1.78 01/14/2014     Assessment/Plan Chronic hip pain, left Due to chronic dislocation.  She remains on tramadol  50 mg 3 times daily and scheduled Tylenol  650 mg 3 times daily.  She has adequate pain control.  Nursing has no concerns about her pain control.  She is no longer on morphine .  Severe late onset Alzheimer's dementia without behavioral disturbance, psychotic disturbance, mood disturbance, or anxiety (HCC) Severe Alzheimer's type dementia.  Late onset.  She is not had any behavioral disturbances.  Her Lexapro was  weaned off recently due to CMS guidelines for gradual dose reduction.  She has not had any side effects from weaning off her antidepressants.  Arthritis Continue scheduled Tylenol  650 mg 3 times daily.  Bedbound She is chronically bedbound.  She is unable to walk.   Kristin Door, DO  [facility] [phone]      [1]  Allergies Allergen Reactions   Etodolac Other (See Comments)    Increased BP   Tape

## 2024-05-22 ENCOUNTER — Encounter: Payer: Self-pay | Admitting: Internal Medicine

## 2024-05-22 DIAGNOSIS — Z7401 Bed confinement status: Secondary | ICD-10-CM | POA: Insufficient documentation

## 2024-05-22 NOTE — Assessment & Plan Note (Signed)
 She is chronically bedbound.  She is unable to walk.

## 2024-05-22 NOTE — Assessment & Plan Note (Signed)
 Severe Alzheimer's type dementia.  Late onset.  She is not had any behavioral disturbances.  Her Lexapro was weaned off recently due to CMS guidelines for gradual dose reduction.  She has not had any side effects from weaning off her antidepressants.

## 2024-05-22 NOTE — Assessment & Plan Note (Signed)
 Continue scheduled Tylenol  650 mg 3 times daily.

## 2024-05-22 NOTE — Assessment & Plan Note (Signed)
 Due to chronic dislocation.  She remains on tramadol  50 mg 3 times daily and scheduled Tylenol  650 mg 3 times daily.  She has adequate pain control.  Nursing has no concerns about her pain control.  She is no longer on morphine .

## 2024-06-03 ENCOUNTER — Other Ambulatory Visit: Payer: Self-pay | Admitting: Nurse Practitioner

## 2024-06-03 ENCOUNTER — Other Ambulatory Visit: Payer: Self-pay

## 2024-06-03 DIAGNOSIS — G8929 Other chronic pain: Secondary | ICD-10-CM

## 2024-06-04 ENCOUNTER — Other Ambulatory Visit: Payer: Self-pay | Admitting: Orthopedic Surgery

## 2024-06-04 DIAGNOSIS — G8929 Other chronic pain: Secondary | ICD-10-CM

## 2024-06-04 MED ORDER — TRAMADOL HCL 50 MG PO TABS: 50.0000 mg | ORAL_TABLET | Freq: Three times a day (TID) | ORAL | 0 refills | Status: DC

## 2024-06-05 NOTE — Telephone Encounter (Signed)
 Duplicate Rx sent in. This is a cancellation of the duplicate Rx

## 2024-06-10 ENCOUNTER — Encounter: Payer: Self-pay | Admitting: Nurse Practitioner

## 2024-06-10 ENCOUNTER — Non-Acute Institutional Stay (SKILLED_NURSING_FACILITY): Payer: Self-pay | Admitting: Nurse Practitioner

## 2024-06-10 DIAGNOSIS — Z515 Encounter for palliative care: Secondary | ICD-10-CM

## 2024-06-10 DIAGNOSIS — G301 Alzheimer's disease with late onset: Secondary | ICD-10-CM | POA: Diagnosis not present

## 2024-06-10 DIAGNOSIS — K920 Hematemesis: Secondary | ICD-10-CM | POA: Diagnosis not present

## 2024-06-10 DIAGNOSIS — M25552 Pain in left hip: Secondary | ICD-10-CM

## 2024-06-10 DIAGNOSIS — G8929 Other chronic pain: Secondary | ICD-10-CM | POA: Diagnosis not present

## 2024-06-10 DIAGNOSIS — F02C0 Dementia in other diseases classified elsewhere, severe, without behavioral disturbance, psychotic disturbance, mood disturbance, and anxiety: Secondary | ICD-10-CM | POA: Diagnosis not present

## 2024-06-10 MED ORDER — LORAZEPAM 0.5 MG PO TABS: 0.5000 mg | ORAL_TABLET | Freq: Three times a day (TID) | ORAL | 1 refills | Status: AC | PRN

## 2024-06-10 MED ORDER — MORPHINE SULFATE (CONCENTRATE) 20 MG/ML PO SOLN: 5.0000 mg | ORAL | 0 refills | Status: AC | PRN

## 2024-06-10 NOTE — Progress Notes (Signed)
 " Location:  Other [place].  Nursing Home Room Number: [facility] [id number] Place of Service:  SNF [phone]) Harlene An, NP  PCP: [name], DO  Patient Care Team: [name], DO as PCP - General (Internal Medicine)  Extended Emergency Contact Information Primary Emergency Contact: [facility] Phone: [phone] Relation: Daughter Preferred language: English Interpreter needed? No Secondary Emergency Contact: [facility] Mobile Phone: [phone] Relation: Son  Goals of care: Advanced Directive information    05/14/2024    1:31 PM  Advanced Directives  Does Patient Have a Medical Advance Directive? Yes  Type of Advance Directive Out of facility DNR (pink MOST or yellow form)  Does patient want to make changes to medical advance directive? No - Patient declined     Chief Complaint  Patient presents with   Emesis    Vomiting     HPI:  Pt is a 89 y.o. female seen today for an acute visit for vomiting. Pt has been admitting to hospice due to weight loss and failure to thrive with advanced dementia.  Nursing noted she had 2 episodes of coffee ground emesis last night and today.  She is not eating or taking medication.  Hospice nurse has been called in to evaluate and requested to start comfort measures.  She is currently resting in the bed without signs of distressed or pain at this time.    Past Medical History:  Diagnosis Date   Chronic idiopathic constipation 11/21/2023   Elevated cholesterol    Hospice care patient 01/27/2023   Hyperlipidemia 06/30/2022   Hypertension    Mood disorder 11/21/2023   Past Surgical History:  Procedure Laterality Date   arthroscopic shoulder Right 2001   BASAL CELL CARCINOMA EXCISION  2008   on nose   basal joint hand replace  2000   CATARACT EXTRACTION, BILATERAL  1999   child birth  39 and 1954   x 2   CYSTECTOMY  2009   lower back   HIP CLOSED REDUCTION Left 08/14/2022   Procedure: CLOSED REDUCTION HIP;   Surgeon: Tobie Priest, MD;  Location: [facility] ORS;  Service: Orthopedics;  Laterality: Left;   PILONIDAL CYST EXCISION  1951   TOTAL HIP ARTHROPLASTY Left 12/01   TOTAL HIP ARTHROPLASTY Right 2003    Allergies[1]  Outpatient Encounter Medications as of 06/10/2024  Medication Sig   acetaminophen  (TYLENOL ) 325 MG tablet Take 325 mg by mouth every 8 (eight) hours as needed.   acetaminophen  (TYLENOL ) 650 MG CR tablet Take 650 mg by mouth 3 (three) times daily.   traMADol  (ULTRAM ) 50 MG tablet Take 1 tablet (50 mg total) by mouth 3 (three) times daily.   ZINC OXIDE EX Apply topically. Apply to sacrum, coccyx, buttocks topically as needed for protection   No facility-administered encounter medications on file as of 06/10/2024.    Review of Systems  Unable to perform ROS: Dementia    Immunization History  Administered Date(s) Administered   Influenza-Unspecified 02/29/2020, 02/26/2021, 02/26/2022, 03/05/2023, 02/24/2024   MODERNA COVID-19 SARS-COV-2 PEDS BIVALENT BOOSTER 25yr-25yr 03/22/2022   Moderna Covid-19 Vaccine Bivalent Booster 72yrs & up 10/09/2021, 03/22/2022   Moderna Sars-Covid-2 Vaccination 05/23/2019, 06/20/2019, 03/22/2020, 09/29/2020, 02/02/2021   PNEUMOCOCCAL CONJUGATE-20 05/14/2023   Pneumococcal Polysaccharide-23 10/12/2011   Pertinent  Health Maintenance Due  Topic Date Due   Influenza Vaccine  Completed      05/12/2021    7:20 PM 12/05/2021    5:21 PM 03/17/2024    1:05 PM 05/14/2024    1:31  PM  Fall Risk  Falls in the past year?   0 Exclusion - non ambulatory  Was there an injury with Fall?   0    Fall Risk Category Calculator   0   (RETIRED) Patient Fall Risk Level High fall risk  High fall risk     Patient at Risk for Falls Due to   History of fall(s);Impaired balance/gait;Impaired mobility History of fall(s);Impaired balance/gait;Impaired mobility  Fall risk Follow up   Falls evaluation completed Falls evaluation completed     Data saved with a previous  flowsheet row definition   Functional Status Survey:    Vitals:   06/10/24 1222  BP: 114/68  Pulse: 78  Resp: 16  Temp: 97.8 F (36.6 C)  SpO2: 95%  Weight: 97 lb (44 kg)  Height: 5' 6 (1.676 m)   Body mass index is 15.66 kg/m. Physical Exam Constitutional:      General: She is not in acute distress.    Appearance: She is well-developed. She is not diaphoretic.  HENT:     Head: Normocephalic and atraumatic.     Mouth/Throat:     Pharynx: No oropharyngeal exudate.  Eyes:     Conjunctiva/sclera: Conjunctivae normal.     Pupils: Pupils are equal, round, and reactive to light.  Cardiovascular:     Rate and Rhythm: Normal rate and regular rhythm.     Heart sounds: Normal heart sounds.  Pulmonary:     Effort: Pulmonary effort is normal.     Breath sounds: Normal breath sounds.  Abdominal:     General: Bowel sounds are normal.     Palpations: Abdomen is soft.  Musculoskeletal:     Cervical back: Normal range of motion and neck supple.     Right lower leg: No edema.     Left lower leg: No edema.  Skin:    General: Skin is warm and dry.  Neurological:     Mental Status: She is alert.  Psychiatric:        Mood and Affect: Mood normal.        Cognition and Memory: Cognition is impaired. Memory is impaired. She exhibits impaired recent memory and impaired remote memory.     Labs reviewed: No results for input(s): NA, K, CL, CO2, GLUCOSE, BUN, CREATININE, CALCIUM, MG, PHOS in the last 8760 hours. No results for input(s): AST, ALT, ALKPHOS, BILITOT, PROT, ALBUMIN in the last 8760 hours. No results for input(s): WBC, NEUTROABS, HGB, HCT, MCV, PLT in the last 8760 hours. Lab Results  Component Value Date   TSH 1.78 01/14/2014   No results found for: HGBA1C No results found for: CHOL, HDL, LDLCALC, LDLDIRECT, TRIG, CHOLHDL  Significant Diagnostic Results in last 30 days:  No results found.  Assessment/Plan 1.  Severe late onset Alzheimer's dementia without behavioral disturbance, psychotic disturbance, mood disturbance, or anxiety (HCC) (Primary) Advanced dementia, supportive and comfort care only at this time Hospice following   2. Chronic hip pain, left - morphine  (ROXANOL) 20 MG/ML concentrated solution; Take 0.25 mLs (5 mg total) by mouth every 2 (two) hours as needed for severe pain (pain score 7-10), shortness of breath, moderate pain (pain score 4-6), breakthrough pain or anxiety.  Dispense: 20 mL; Refill: 0  3. Hospice care patient - LORazepam  (ATIVAN ) 0.5 MG tablet; Take 1 tablet (0.5 mg total) by mouth every 8 (eight) hours as needed for anxiety.  Dispense: 30 tablet; Refill: 1 - morphine  (ROXANOL) 20 MG/ML concentrated solution; Take 0.25 mLs (  5 mg total) by mouth every 2 (two) hours as needed for severe pain (pain score 7-10), shortness of breath, moderate pain (pain score 4-6), breakthrough pain or anxiety.  Dispense: 20 mL; Refill: 0  4. Coffee ground emesis -comfort measures only, PRN of prochlorperzine 10 mg q 4 hours PRN n/v for comfort.     Kristin Woods K. Caro BODILY [facility] [phone]       [1]  Allergies Allergen Reactions   Etodolac Other (See Comments)    Increased BP   Tape    "

## 2024-06-17 ENCOUNTER — Non-Acute Institutional Stay (SKILLED_NURSING_FACILITY): Payer: Self-pay | Admitting: Nurse Practitioner

## 2024-06-17 ENCOUNTER — Encounter: Payer: Self-pay | Admitting: Nurse Practitioner

## 2024-06-17 DIAGNOSIS — F39 Unspecified mood [affective] disorder: Secondary | ICD-10-CM

## 2024-06-17 DIAGNOSIS — F02C0 Dementia in other diseases classified elsewhere, severe, without behavioral disturbance, psychotic disturbance, mood disturbance, and anxiety: Secondary | ICD-10-CM

## 2024-06-17 DIAGNOSIS — G301 Alzheimer's disease with late onset: Secondary | ICD-10-CM

## 2024-06-17 DIAGNOSIS — M1990 Unspecified osteoarthritis, unspecified site: Secondary | ICD-10-CM

## 2024-06-17 DIAGNOSIS — M25552 Pain in left hip: Secondary | ICD-10-CM

## 2024-06-17 DIAGNOSIS — G8929 Other chronic pain: Secondary | ICD-10-CM

## 2024-06-17 NOTE — Assessment & Plan Note (Signed)
 Advanced dementia, not taking oral medication at this time. Minimal oral intake. Hospice pt and comfort measures only at this time.

## 2024-06-17 NOTE — Progress Notes (Signed)
 " Location:  Other [place].  Nursing Home Room Number: [facility] [id number] Place of Service:  SNF [phone]) Harlene An, NP  PCP: [name], DO  Patient Care Team: [name], DO as PCP - General (Internal Medicine)  Extended Emergency Contact Information Primary Emergency Contact: [facility] Phone: [phone] Relation: Daughter Preferred language: English Interpreter needed? No Secondary Emergency Contact: [facility] Mobile Phone: [phone] Relation: Son  Goals of care: Advanced Directive information    05/14/2024    1:31 PM  Advanced Directives  Does Patient Have a Medical Advance Directive? Yes  Type of Advance Directive Out of facility DNR (pink MOST or yellow form)  Does patient want to make changes to medical advance directive? No - Patient declined     Chief Complaint  Patient presents with   Medical Management of Chronic Issues    Medical Management of Chronic Issues.     HPI:  Pt is a 89 y.o. female seen today for medical management of chronic disease. Pt with hx of dementia, htn, chronic hip pain due to chronic dislocation.  She was having nausea and vomiting but this has resolved.  She is eating minimally  No signs of pains She has comfort medication in place.    Past Medical History:  Diagnosis Date   Chronic idiopathic constipation 11/21/2023   Elevated cholesterol    Hospice care patient 01/27/2023   Hyperlipidemia 06/30/2022   Hypertension    Mood disorder 11/21/2023   Past Surgical History:  Procedure Laterality Date   arthroscopic shoulder Right 2001   BASAL CELL CARCINOMA EXCISION  2008   on nose   basal joint hand replace  2000   CATARACT EXTRACTION, BILATERAL  1999   child birth  25 and 1954   x 2   CYSTECTOMY  2009   lower back   HIP CLOSED REDUCTION Left 08/14/2022   Procedure: CLOSED REDUCTION HIP;  Surgeon: Tobie Priest, MD;  Location: [facility] ORS;  Service: Orthopedics;  Laterality: Left;   PILONIDAL CYST  EXCISION  1951   TOTAL HIP ARTHROPLASTY Left 12/01   TOTAL HIP ARTHROPLASTY Right 2003    Allergies[1]  Outpatient Encounter Medications as of 06/17/2024  Medication Sig   acetaminophen  (TYLENOL ) 325 MG tablet Take 325 mg by mouth every 8 (eight) hours as needed.   LORazepam  (ATIVAN ) 0.5 MG tablet Take 1 tablet (0.5 mg total) by mouth every 8 (eight) hours as needed for anxiety.   morphine  (ROXANOL) 20 MG/ML concentrated solution Take 0.25 mLs (5 mg total) by mouth every 2 (two) hours as needed for severe pain (pain score 7-10), shortness of breath, moderate pain (pain score 4-6), breakthrough pain or anxiety.   prochlorperazine (COMPAZINE) 10 MG tablet Take 10 mg by mouth every 4 (four) hours as needed for nausea or vomiting.   ZINC OXIDE EX Apply topically. Apply to sacrum, coccyx, buttocks topically as needed for protection   acetaminophen  (TYLENOL ) 650 MG CR tablet Take 650 mg by mouth 3 (three) times daily. (Patient not taking: Reported on 06/17/2024)   traMADol  (ULTRAM ) 50 MG tablet Take 1 tablet (50 mg total) by mouth 3 (three) times daily. (Patient not taking: Reported on 06/17/2024)   No facility-administered encounter medications on file as of 06/17/2024.    Review of Systems  Unable to perform ROS: Dementia     Immunization History  Administered Date(s) Administered   Influenza-Unspecified 02/29/2020, 02/26/2021, 02/26/2022, 03/05/2023, 02/24/2024   MODERNA COVID-19 SARS-COV-2 PEDS BIVALENT BOOSTER 27yr-51yr 03/22/2022   Moderna  Covid-19 Vaccine Bivalent Booster 23yrs & up 10/09/2021, 03/22/2022   Moderna Sars-Covid-2 Vaccination 05/23/2019, 06/20/2019, 03/22/2020, 09/29/2020, 02/02/2021   PNEUMOCOCCAL CONJUGATE-20 05/14/2023   Pneumococcal Polysaccharide-23 10/12/2011   Pertinent  Health Maintenance Due  Topic Date Due   Influenza Vaccine  Completed      05/12/2021    7:20 PM 12/05/2021    5:21 PM 03/17/2024    1:05 PM 05/14/2024    1:31 PM  Fall Risk  Falls in the past  year?   0 Exclusion - non ambulatory  Was there an injury with Fall?   0    Fall Risk Category Calculator   0   (RETIRED) Patient Fall Risk Level High fall risk  High fall risk     Patient at Risk for Falls Due to   History of fall(s);Impaired balance/gait;Impaired mobility History of fall(s);Impaired balance/gait;Impaired mobility  Fall risk Follow up   Falls evaluation completed Falls evaluation completed     Data saved with a previous flowsheet row definition   Functional Status Survey:    Vitals:   06/17/24 1102  BP: 109/64  Pulse: 66  Resp: 18  Temp: 97.8 F (36.6 C)  SpO2: 94%  Weight: 97 lb (44 kg)  Height: 5' 6 (1.676 m)   Body mass index is 15.66 kg/m. Physical Exam Constitutional:      General: She is not in acute distress.    Appearance: She is well-developed. She is not diaphoretic.  HENT:     Head: Normocephalic and atraumatic.     Mouth/Throat:     Pharynx: No oropharyngeal exudate.  Eyes:     Conjunctiva/sclera: Conjunctivae normal.     Pupils: Pupils are equal, round, and reactive to light.  Cardiovascular:     Rate and Rhythm: Normal rate and regular rhythm.     Heart sounds: Normal heart sounds.  Pulmonary:     Effort: Pulmonary effort is normal.     Breath sounds: Normal breath sounds.  Abdominal:     General: Bowel sounds are normal.     Palpations: Abdomen is soft.  Musculoskeletal:     Cervical back: Normal range of motion and neck supple.     Right lower leg: No edema.     Left lower leg: No edema.  Skin:    General: Skin is warm and dry.  Neurological:     Mental Status: She is alert.     Motor: Weakness present.     Gait: Gait abnormal.  Psychiatric:        Mood and Affect: Mood normal.     Labs reviewed: No results for input(s): NA, K, CL, CO2, GLUCOSE, BUN, CREATININE, CALCIUM, MG, PHOS in the last 8760 hours. No results for input(s): AST, ALT, ALKPHOS, BILITOT, PROT, ALBUMIN in the last 8760  hours. No results for input(s): WBC, NEUTROABS, HGB, HCT, MCV, PLT in the last 8760 hours. Lab Results  Component Value Date   TSH 1.78 01/14/2014   No results found for: HGBA1C No results found for: CHOL, HDL, LDLCALC, LDLDIRECT, TRIG, CHOLHDL  Significant Diagnostic Results in last 30 days:  No results found.  Assessment/Plan Severe late onset Alzheimer's dementia without behavioral disturbance, psychotic disturbance, mood disturbance, or anxiety (HCC) Advanced dementia, not taking oral medication at this time. Minimal oral intake. Hospice pt and comfort measures only at this time.   Chronic hip pain, left Due to chronic dislocation.  She is not taking medication at this time. Morphine  has been order for comfort  Arthritis Stable at this time, has morphine  at this time, no longer taking any scheduled medication at this time.   Mood disorder Has weaned off all medication and stable.      Natanael Saladin K. Caro BODILY [facility] [phone]       [1]  Allergies Allergen Reactions   Etodolac Other (See Comments)    Increased BP   Tape    "

## 2024-06-17 NOTE — Assessment & Plan Note (Signed)
 Stable at this time, has morphine  at this time, no longer taking any scheduled medication at this time.

## 2024-06-17 NOTE — Assessment & Plan Note (Signed)
 Has weaned off all medication and stable.

## 2024-06-17 NOTE — Assessment & Plan Note (Signed)
 Due to chronic dislocation.  She is not taking medication at this time. Morphine  has been order for comfort
